# Patient Record
Sex: Female | Born: 1950
Health system: Southern US, Community
[De-identification: ages and names within clinical notes are randomized; demographics above are authoritative.]

## PROBLEM LIST (undated history)

## (undated) DIAGNOSIS — F32A Depression, unspecified: Secondary | ICD-10-CM

## (undated) DIAGNOSIS — F329 Major depressive disorder, single episode, unspecified: Secondary | ICD-10-CM

## (undated) DIAGNOSIS — I10 Essential (primary) hypertension: Secondary | ICD-10-CM

## (undated) DIAGNOSIS — E538 Deficiency of other specified B group vitamins: Secondary | ICD-10-CM

## (undated) HISTORY — DX: Major depressive disorder, single episode, unspecified: F32.9

## (undated) HISTORY — DX: Depression, unspecified: F32.A

## (undated) HISTORY — DX: Essential (primary) hypertension: I10

## (undated) HISTORY — DX: Deficiency of other specified B group vitamins: E53.8

## (undated) HISTORY — PX: TUBAL LIGATION: SHX77

---

## 1999-10-15 ENCOUNTER — Other Ambulatory Visit: Admission: RE | Admit: 1999-10-15 | Discharge: 1999-10-15 | Payer: Self-pay | Admitting: Family Medicine

## 2010-06-27 ENCOUNTER — Encounter: Payer: Self-pay | Admitting: Internal Medicine

## 2010-07-02 NOTE — Letter (Signed)
Summary: Pre Visit Letter Revised  [facility]  [address] [place], [place] [address]   Phone: [phone]  Fax: [phone]        06/27/2010 MRN: [id number]  [facility] [address] [place], [place]  [address]             Procedure Date: 08-15-10 10:30am           Dr Leone Payor Direct Colon   Welcome to the Gastroenterology Division at [facility].    You are scheduled to see a nurse for your pre-procedure visit on 08-01-10 at 2:30pm on the 3rd floor at [facility], [address]  We ask that you try to arrive at our office 15 minutes prior to your appointment time to allow for check-in.  Please take a minute to review the attached form.  If you answer "Yes" to one or more of the questions on the first page, we ask that you call the person listed at your earliest opportunity.  If you answer "No" to all of the questions, please complete the rest of the form and bring it to your appointment.    Your nurse visit will consist of discussing your medical and surgical history, your immediate family medical history, and your medications.   If you are unable to list all of your medications on the form, please bring the medication bottles to your appointment and we will list them.  We will need to be aware of both prescribed and over the counter drugs.  We will need to know exact dosage information as well.    Please be prepared to read and sign documents such as consent forms, a financial agreement, and acknowledgement forms.  If necessary, and with your consent, a friend or relative is welcome to sit-in on the nurse visit with you.  Please bring your insurance card so that we may make a copy of it.  If your insurance requires a referral to see a specialist, please bring your referral form from your primary care physician.  No co-pay is required for this nurse visit.     If you cannot keep your appointment, please call [phone] to cancel or reschedule  prior to your appointment date.  This allows [place] the opportunity to schedule an appointment for another patient in need of care.    Thank you for choosing [facility] for your medical needs.  We appreciate the opportunity to care for you.  Please visit [place] at our website  to learn more about our practice.  Sincerely, The Gastroenterology Division

## 2010-08-01 ENCOUNTER — Ambulatory Visit (AMBULATORY_SURGERY_CENTER): Payer: Managed Care, Other (non HMO) | Admitting: *Deleted

## 2010-08-01 VITALS — Ht 62.0 in | Wt 232.0 lb

## 2010-08-01 DIAGNOSIS — Z1211 Encounter for screening for malignant neoplasm of colon: Secondary | ICD-10-CM

## 2010-08-01 MED ORDER — PEG-KCL-NACL-NASULF-NA ASC-C 100 G PO SOLR: ORAL | Status: DC

## 2010-08-14 ENCOUNTER — Encounter: Payer: Self-pay | Admitting: Internal Medicine

## 2010-08-15 ENCOUNTER — Encounter: Payer: Self-pay | Admitting: Internal Medicine

## 2010-08-15 ENCOUNTER — Ambulatory Visit: Payer: Managed Care, Other (non HMO) | Admitting: Internal Medicine

## 2010-08-15 VITALS — BP 116/53 | HR 81 | Temp 98.1°F | Resp 18 | Ht 62.0 in | Wt 225.0 lb

## 2010-08-15 DIAGNOSIS — D126 Benign neoplasm of colon, unspecified: Secondary | ICD-10-CM

## 2010-08-15 DIAGNOSIS — K5730 Diverticulosis of large intestine without perforation or abscess without bleeding: Secondary | ICD-10-CM

## 2010-08-15 DIAGNOSIS — Z1211 Encounter for screening for malignant neoplasm of colon: Secondary | ICD-10-CM

## 2010-08-15 DIAGNOSIS — K621 Rectal polyp: Secondary | ICD-10-CM

## 2010-08-15 DIAGNOSIS — D1330 Benign neoplasm of unspecified part of small intestine: Secondary | ICD-10-CM

## 2010-08-15 DIAGNOSIS — D128 Benign neoplasm of rectum: Secondary | ICD-10-CM

## 2010-08-15 DIAGNOSIS — D129 Benign neoplasm of anus and anal canal: Secondary | ICD-10-CM

## 2010-08-15 DIAGNOSIS — K620 Anal polyp: Secondary | ICD-10-CM

## 2010-08-15 LAB — GLUCOSE, CAPILLARY: Glucose-Capillary: 125 mg/dL — ABNORMAL HIGH (ref 70–99)

## 2010-08-15 MED ORDER — SODIUM CHLORIDE 0.9 % IV SOLN: 500.0000 mL | INTRAVENOUS | Status: DC

## 2010-08-15 NOTE — Patient Instructions (Signed)
HANDOUTS GIVEN ON POLYPS, DIVERTICULOSIS, HIGH FIBER DIET, HEMORRHOIDS YOU WILL RECEIVE A LETTER FROM DR Leone Payor IN 1-2 WEEKS WITH PATHOLOGY REPORT RESULTS AND HIS RECOMMENDATIONS FOR YOUR NEXT PROCEDURE

## 2010-08-16 ENCOUNTER — Telehealth: Payer: Self-pay

## 2010-08-16 NOTE — Telephone Encounter (Signed)
I called home # and there was no answer.  MAW

## 2010-08-21 ENCOUNTER — Encounter: Payer: Self-pay | Admitting: Internal Medicine

## 2010-08-21 NOTE — Progress Notes (Signed)
Quick Note:  No adenomas 92 hyperplastic polyps) Repeat colonoscopy 10 years 2022 ______

## 2010-10-01 ENCOUNTER — Telehealth: Payer: Self-pay | Admitting: Internal Medicine

## 2010-10-01 NOTE — Telephone Encounter (Signed)
Have her see PCP and please ask them to send [place] their eval Make sure they have the records from [place] as well - including nursing notes from procedure Not normal but sometimes there is post IV pain - different causes and will await PCP eval

## 2010-10-01 NOTE — Telephone Encounter (Signed)
I spoke with the patient and Dr Tomasa Blase.  They will send a copy of the note.  I have faxed the documentation that pertain to the IV from the procedure from 08/15/10

## 2010-10-01 NOTE — Telephone Encounter (Signed)
Patient had a colon on 08/15/10.  Since then she  Has pain in the hand her IV was placed in.  The pain is not daily.  It is a throbbing pain.  There is no redness, swelling, or signs of infection.  She states the vein is not hard, but when she touches the site it is painful.  She is wondering if this was normal.  She states she will have a day of pain then may go three or four days with no pain at all.  She dose have an appt with her primary MD on Thursday and she is going to have him look at her hand.  Dr Leone Payor do you have any suggestion?

## 2010-10-17 ENCOUNTER — Telehealth: Payer: Self-pay

## 2010-10-17 NOTE — Telephone Encounter (Signed)
CQI F/U of pt c/o hand pain at IV site.  Spoke w/pt today, she says she still has some tingling at times and on occasion has sharp pain in her hand.  She saw her PCP, Dr. Veatrice Kells from [facility], for R/T Phys. on 10/03/2010 and mentioned the hand to him - she reports that he told her that he could see where the IV had been and that it would just take time for the pain to get better.  She stated that she will just give it time to get better.  I told the patient that if over time she did not see improvement she should call the office to report the continued symptoms.

## 2010-10-22 ENCOUNTER — Encounter: Payer: Self-pay | Admitting: Internal Medicine

## 2010-12-05 NOTE — Progress Notes (Signed)
Addended by: Virgel Paling on: 12/05/2010 12:05 PM   Modules accepted: Orders

## 2010-12-05 NOTE — Progress Notes (Signed)
Addended by: Virgel Paling on: 12/05/2010 12:04 PM   Modules accepted: Level of Service

## 2011-01-07 ENCOUNTER — Telehealth: Payer: Self-pay

## 2011-01-07 NOTE — Telephone Encounter (Signed)
Shelby Mendoza called last week and inadvertently got Shelby Dane, LPN from [employer] instead of me.  Shelby Mendoza left me a note with Shelby Mendoza contact information and her comments.  The pt told Shelby Mendoza that her IV site in her hand was not better and actually seems worse. She said she had problems opening her medication bottles.  I phoned Shelby Mendoza this morning to ask how she was doing. She told me that her hand was "still sore" and that she has" had some pain and tingling up her arm, especially at night."  She also states, "it comes and goes." She related that she has some problems opening her medication bottles at times due to the soreness in her hand.  She reminded me that she had seen her medical doctor shortly after the procedure and he told her that he could see the place on her hand where the IV site was and that it would just take time to heal.  She said she was satisfied to be patient and see what happens.

## 2011-09-17 ENCOUNTER — Ambulatory Visit (HOSPITAL_COMMUNITY)
Admission: RE | Admit: 2011-09-17 | Discharge: 2011-09-17 | Disposition: A | Discharge status: Home / Self Care | Payer: Managed Care, Other (non HMO) | Attending: Psychiatry | Admitting: Psychiatry

## 2011-09-17 DIAGNOSIS — E538 Deficiency of other specified B group vitamins: Secondary | ICD-10-CM | POA: Insufficient documentation

## 2011-09-17 DIAGNOSIS — Z800 Family history of malignant neoplasm of digestive organs: Secondary | ICD-10-CM | POA: Insufficient documentation

## 2011-09-17 DIAGNOSIS — F29 Unspecified psychosis not due to a substance or known physiological condition: Secondary | ICD-10-CM | POA: Insufficient documentation

## 2011-09-17 DIAGNOSIS — E119 Type 2 diabetes mellitus without complications: Secondary | ICD-10-CM | POA: Insufficient documentation

## 2011-09-17 DIAGNOSIS — F329 Major depressive disorder, single episode, unspecified: Secondary | ICD-10-CM | POA: Insufficient documentation

## 2011-09-17 DIAGNOSIS — F3289 Other specified depressive episodes: Secondary | ICD-10-CM | POA: Insufficient documentation

## 2011-09-17 NOTE — BH Assessment (Signed)
Assessment Note   Shelby Mendoza is an 61 y.o. female. PT PRESENTED TO [facility] WITH HER HUSBAND AND DAUGHTER DUE TO CHANGE IN MENTAL STATUS AFTER TAKING ANTIBIOTICS FOR 20 DAYS AND GETTING KENALOG INJECTION. Sunday PT BEGAN SCREAMING AND KNOCKED OBJECTS OFF THE TABLE. 2 DAYS AGO SHE HAD A EPISODE OF CURSING AND FAMILY MEMBERS REPORTED SHE NEVER CURSES.  YESTERDAY SHE CONTINUED TO TAKE HER CLOTHES OFF AT HOME AFTER HER HUSBAND REDRESSED HER SEVERAL TIMES. FAMILY REPORTED PT HAS HAD CONFLICT WITH HER BROTHER-IN-LAW OVER THE CARE OF HER ILL SISTER. SHE ALSO RECENTLY RETIRED AND TRIES TO DO EVERYTHING. SHE HAS BEEN VERY STRESSED LATELY. PT'S MOTHER DIED OVER 30 YEARS AGO AND PT NEVER DELT WITH HER DEATH AND NOW IT'S AN ONGOING TOPIC WITH PT TRYING TO DEAL WITH IT.  PT WAS  TAKEN TO [facility], GIVEN A CAT SCAN AND BLOOD WORK WAS COMPLETED. PT WAS INFORMED EVERYTHING WAS NORMAL AND WAS DISCHARGED.  ACCORDING TO FAMILY PT GOT ABOUT ELEVEN HOURS SLEEP LAST NIGHT AND HAS NOT HAD AN EPISODE TODAY. PT WAS VERY TALKATIVE AT TIMES BUT STILL NOT IN A MANIC STATE. PT DENIES S/I, H/I AND IS NOT CURRENTLY PSYCHOTIC.  FAMILY WANTS TO GET PT INVOLVED WITH A PSYCHIATRIST. DISCUSSED CASE WITH DR READLING WHO REPORTS PT MAY HAVE SON DELIRIUM GOING ON AND WOULD NEED TO GO THE THE ER FOR MEDICAL CLEARANCE. PT AND SPOUSE PREFERS OUT PATIENT REFERRAL. PT REFERRED TO DR PLOVSKY.        Axis I: Psychotic Disorder NOS Axis II: Deferred Axis III:  Past Medical History  Diagnosis Date  . Hypertension   . Depression   . Vitamin B12 deficiency   . Diabetes mellitus    Axis IV: problems related to social environment Axis V: 61-70 mild symptoms        Past Medical History:  Past Medical History  Diagnosis Date  . Hypertension   . Depression   . Vitamin B12 deficiency   . Diabetes mellitus     Past Surgical History  Procedure Date  . Tubal ligation     Family History:  Family History  Problem  Relation Age of Onset  . Colon cancer Maternal Aunt     Social History:  reports that she has never smoked. She has never used smokeless tobacco. She reports that she does not drink alcohol. Her drug history not on file.  Additional Social History:     CIWA:   COWS:    Allergies: No Known Allergies  Home Medications:  (Not in a hospital admission)  OB/GYN Status:  No LMP recorded. Patient is postmenopausal.  General Assessment Data Location of Assessment: [facility] Assessment Services Living Arrangements: Spouse/significant other Can pt return to current living arrangement?: Yes Admission Status: Voluntary Is patient capable of signing voluntary admission?: Yes Transfer from: Home Referral Source: Self/Family/Friend  Education Status Is patient currently in school?: No Contact person: Viviann Spare [phone] (spouse)  Risk to self Suicidal Ideation: No Suicidal Intent: No Is patient at risk for suicide?: No Suicidal Plan?: No Access to Means: No What has been your use of drugs/alcohol within the last 12 months?: none Previous Attempts/Gestures: No How many times?: 0  Other Self Harm Risks: na Triggers for Past Attempts: None known Intentional Self Injurious Behavior: None Family Suicide History: No Recent stressful life event(s): Turmoil (Comment) (with brother-in law) Persecutory voices/beliefs?: No Depression: No Substance abuse history and/or treatment for substance abuse?: No Suicide prevention information given to non-admitted patients:  Not applicable  Risk to Others Homicidal Ideation: No Thoughts of Harm to Others: No Current Homicidal Intent: No Current Homicidal Plan: No Access to Homicidal Means: No Identified Victim: na History of harm to others?: No Assessment of Violence: None Noted Violent Behavior Description: na Does patient have access to weapons?: No Criminal Charges Pending?: No Does patient have a court date:  No  Psychosis Hallucinations: None noted Delusions: Unspecified (changes in behavior)  Mental Status Report Appear/Hygiene: Improved Eye Contact: Good Motor Activity: Freedom of movement Speech: Logical/coherent Level of Consciousness: Alert Mood: Ashamed/humiliated Affect: Appropriate to circumstance Anxiety Level: Minimal Judgement: Unimpaired Orientation: Person;Place;Time;Situation Obsessive Compulsive Thoughts/Behaviors: None  Cognitive Functioning Concentration: Decreased Memory: Recent Intact;Remote Intact IQ: Average Insight: Good Impulse Control: Good Appetite: Good Sleep: No Change Total Hours of Sleep: 8  Vegetative Symptoms: None  ADLScreening [facility] Assessment Services) Patient's cognitive ability adequate to safely complete daily activities?: Yes Patient able to express need for assistance with ADLs?: Yes Independently performs ADLs?: Yes  Abuse/Neglect [facility]) Physical Abuse: Denies Verbal Abuse: Denies Sexual Abuse: Denies  Prior Inpatient Therapy Prior Inpatient Therapy: No  Prior Outpatient Therapy Prior Outpatient Therapy: No  ADL Screening (condition at time of admission) Patient's cognitive ability adequate to safely complete daily activities?: Yes Patient able to express need for assistance with ADLs?: Yes Independently performs ADLs?: Yes       Abuse/Neglect Assessment (Assessment to be complete while patient is alone) Physical Abuse: Denies Verbal Abuse: Denies Sexual Abuse: Denies          Additional Information 1:1 In Past 12 Months?: No CIRT Risk: No Elopement Risk: No Does patient have medical clearance?: No     Disposition:  REFERRED TO DR PLOVSKY   Disposition Disposition of Patient: Referred to (DR PLOVSKY) Patient referred to: Other (Comment) (DR PLOVSKY)  On Site Evaluation by:  DR Allena Katz Reviewed with Physician:     Hattie Perch Winford 09/17/2011 11:47 AM

## 2015-04-04 DIAGNOSIS — H6123 Impacted cerumen, bilateral: Secondary | ICD-10-CM | POA: Diagnosis not present

## 2015-04-04 DIAGNOSIS — R7301 Impaired fasting glucose: Secondary | ICD-10-CM | POA: Diagnosis not present

## 2015-04-04 DIAGNOSIS — D510 Vitamin B12 deficiency anemia due to intrinsic factor deficiency: Secondary | ICD-10-CM | POA: Diagnosis not present

## 2015-04-04 DIAGNOSIS — I10 Essential (primary) hypertension: Secondary | ICD-10-CM | POA: Diagnosis not present

## 2015-04-04 DIAGNOSIS — E785 Hyperlipidemia, unspecified: Secondary | ICD-10-CM | POA: Diagnosis not present

## 2015-04-04 DIAGNOSIS — Z6836 Body mass index (BMI) 36.0-36.9, adult: Secondary | ICD-10-CM | POA: Diagnosis not present

## 2015-04-04 DIAGNOSIS — M545 Low back pain: Secondary | ICD-10-CM | POA: Diagnosis not present

## 2015-06-26 DIAGNOSIS — M5137 Other intervertebral disc degeneration, lumbosacral region: Secondary | ICD-10-CM | POA: Diagnosis not present

## 2015-06-26 DIAGNOSIS — M62830 Muscle spasm of back: Secondary | ICD-10-CM | POA: Diagnosis not present

## 2015-06-26 DIAGNOSIS — M5136 Other intervertebral disc degeneration, lumbar region: Secondary | ICD-10-CM | POA: Diagnosis not present

## 2015-06-26 DIAGNOSIS — D259 Leiomyoma of uterus, unspecified: Secondary | ICD-10-CM | POA: Diagnosis not present

## 2015-06-26 DIAGNOSIS — M4696 Unspecified inflammatory spondylopathy, lumbar region: Secondary | ICD-10-CM | POA: Diagnosis not present

## 2015-06-26 DIAGNOSIS — Z6837 Body mass index (BMI) 37.0-37.9, adult: Secondary | ICD-10-CM | POA: Diagnosis not present

## 2015-07-19 DIAGNOSIS — M62830 Muscle spasm of back: Secondary | ICD-10-CM | POA: Diagnosis not present

## 2015-07-19 DIAGNOSIS — Z6837 Body mass index (BMI) 37.0-37.9, adult: Secondary | ICD-10-CM | POA: Diagnosis not present

## 2015-07-19 DIAGNOSIS — M5137 Other intervertebral disc degeneration, lumbosacral region: Secondary | ICD-10-CM | POA: Diagnosis not present

## 2015-07-20 ENCOUNTER — Other Ambulatory Visit: Payer: Self-pay | Admitting: Internal Medicine

## 2015-07-20 DIAGNOSIS — M5136 Other intervertebral disc degeneration, lumbar region: Secondary | ICD-10-CM

## 2015-07-24 DIAGNOSIS — Z6837 Body mass index (BMI) 37.0-37.9, adult: Secondary | ICD-10-CM | POA: Diagnosis not present

## 2015-07-24 DIAGNOSIS — E669 Obesity, unspecified: Secondary | ICD-10-CM | POA: Diagnosis not present

## 2015-07-24 DIAGNOSIS — M5137 Other intervertebral disc degeneration, lumbosacral region: Secondary | ICD-10-CM | POA: Diagnosis not present

## 2015-08-07 ENCOUNTER — Ambulatory Visit
Admission: RE | Admit: 2015-08-07 | Discharge: 2015-08-07 | Disposition: A | Discharge status: Home / Self Care | Payer: Medicare HMO | Source: Ambulatory Visit | Attending: Internal Medicine | Admitting: Internal Medicine

## 2015-08-07 DIAGNOSIS — M5136 Other intervertebral disc degeneration, lumbar region: Secondary | ICD-10-CM

## 2015-08-07 DIAGNOSIS — M4806 Spinal stenosis, lumbar region: Secondary | ICD-10-CM | POA: Diagnosis not present

## 2015-08-08 DIAGNOSIS — Z6837 Body mass index (BMI) 37.0-37.9, adult: Secondary | ICD-10-CM | POA: Diagnosis not present

## 2015-08-08 DIAGNOSIS — M5137 Other intervertebral disc degeneration, lumbosacral region: Secondary | ICD-10-CM | POA: Diagnosis not present

## 2015-08-08 DIAGNOSIS — M62830 Muscle spasm of back: Secondary | ICD-10-CM | POA: Diagnosis not present

## 2015-08-08 DIAGNOSIS — M5416 Radiculopathy, lumbar region: Secondary | ICD-10-CM | POA: Diagnosis not present

## 2015-08-08 DIAGNOSIS — M9983 Other biomechanical lesions of lumbar region: Secondary | ICD-10-CM | POA: Diagnosis not present

## 2015-08-27 DIAGNOSIS — M4315 Spondylolisthesis, thoracolumbar region: Secondary | ICD-10-CM | POA: Diagnosis not present

## 2015-08-27 DIAGNOSIS — M549 Dorsalgia, unspecified: Secondary | ICD-10-CM | POA: Diagnosis not present

## 2015-08-27 DIAGNOSIS — I10 Essential (primary) hypertension: Secondary | ICD-10-CM | POA: Diagnosis not present

## 2015-08-27 DIAGNOSIS — Z6837 Body mass index (BMI) 37.0-37.9, adult: Secondary | ICD-10-CM | POA: Diagnosis not present

## 2015-08-29 ENCOUNTER — Other Ambulatory Visit: Payer: Self-pay | Admitting: Neurosurgery

## 2015-08-30 ENCOUNTER — Other Ambulatory Visit: Payer: Self-pay | Admitting: Neurosurgery

## 2015-08-30 DIAGNOSIS — M4315 Spondylolisthesis, thoracolumbar region: Secondary | ICD-10-CM

## 2015-09-11 ENCOUNTER — Ambulatory Visit
Admission: RE | Admit: 2015-09-11 | Discharge: 2015-09-11 | Disposition: A | Discharge status: Home / Self Care | Payer: Medicare HMO | Source: Ambulatory Visit | Attending: Neurosurgery | Admitting: Neurosurgery

## 2015-09-11 DIAGNOSIS — M5416 Radiculopathy, lumbar region: Secondary | ICD-10-CM | POA: Diagnosis not present

## 2015-09-11 DIAGNOSIS — M4315 Spondylolisthesis, thoracolumbar region: Secondary | ICD-10-CM

## 2015-09-11 MED ORDER — IOPAMIDOL (ISOVUE-M 200) INJECTION 41%: 1.0000 mL | Freq: Once | INTRAMUSCULAR | Status: DC

## 2015-09-11 NOTE — Discharge Instructions (Signed)
Cyst Aspiration Post Procedure Discharge Instructions  1. May resume a regular diet and any medications that you routinely take (including pain medications). 2. No driving day of procedure. 3. Upon discharge go home and rest for at least 4 hours.  May use an ice pack as needed to injection sites on back. 4. Remove bandades later, today.    Please contact our office at [phone] for the following symptoms:   Fever greater than 100 degrees  Increased swelling, pain, or redness at injection site.   Thank you for visiting [facility].

## 2015-09-24 DIAGNOSIS — M4315 Spondylolisthesis, thoracolumbar region: Secondary | ICD-10-CM | POA: Diagnosis not present

## 2015-09-24 DIAGNOSIS — Z6839 Body mass index (BMI) 39.0-39.9, adult: Secondary | ICD-10-CM | POA: Diagnosis not present

## 2015-10-11 DIAGNOSIS — E785 Hyperlipidemia, unspecified: Secondary | ICD-10-CM | POA: Diagnosis not present

## 2015-10-11 DIAGNOSIS — Z1389 Encounter for screening for other disorder: Secondary | ICD-10-CM | POA: Diagnosis not present

## 2015-10-11 DIAGNOSIS — I10 Essential (primary) hypertension: Secondary | ICD-10-CM | POA: Diagnosis not present

## 2015-10-11 DIAGNOSIS — D510 Vitamin B12 deficiency anemia due to intrinsic factor deficiency: Secondary | ICD-10-CM | POA: Diagnosis not present

## 2015-10-11 DIAGNOSIS — R7301 Impaired fasting glucose: Secondary | ICD-10-CM | POA: Diagnosis not present

## 2015-10-11 DIAGNOSIS — Z0000 Encounter for general adult medical examination without abnormal findings: Secondary | ICD-10-CM | POA: Diagnosis not present

## 2015-10-11 DIAGNOSIS — Z6836 Body mass index (BMI) 36.0-36.9, adult: Secondary | ICD-10-CM | POA: Diagnosis not present

## 2015-10-11 DIAGNOSIS — Z9181 History of falling: Secondary | ICD-10-CM | POA: Diagnosis not present

## 2015-10-18 DIAGNOSIS — M25551 Pain in right hip: Secondary | ICD-10-CM | POA: Diagnosis not present

## 2015-10-18 DIAGNOSIS — Z6837 Body mass index (BMI) 37.0-37.9, adult: Secondary | ICD-10-CM | POA: Diagnosis not present

## 2015-10-18 DIAGNOSIS — M160 Bilateral primary osteoarthritis of hip: Secondary | ICD-10-CM | POA: Diagnosis not present

## 2015-11-19 DIAGNOSIS — M4806 Spinal stenosis, lumbar region: Secondary | ICD-10-CM | POA: Diagnosis not present

## 2015-11-19 DIAGNOSIS — M545 Low back pain: Secondary | ICD-10-CM | POA: Diagnosis not present

## 2015-11-19 DIAGNOSIS — M5137 Other intervertebral disc degeneration, lumbosacral region: Secondary | ICD-10-CM | POA: Diagnosis not present

## 2015-11-19 DIAGNOSIS — M47817 Spondylosis without myelopathy or radiculopathy, lumbosacral region: Secondary | ICD-10-CM | POA: Diagnosis not present

## 2016-04-28 DIAGNOSIS — Z6838 Body mass index (BMI) 38.0-38.9, adult: Secondary | ICD-10-CM | POA: Diagnosis not present

## 2016-04-28 DIAGNOSIS — H6693 Otitis media, unspecified, bilateral: Secondary | ICD-10-CM | POA: Diagnosis not present

## 2016-07-22 DIAGNOSIS — R69 Illness, unspecified: Secondary | ICD-10-CM | POA: Diagnosis not present

## 2016-09-24 DIAGNOSIS — Z0101 Encounter for examination of eyes and vision with abnormal findings: Secondary | ICD-10-CM | POA: Diagnosis not present

## 2016-10-14 DIAGNOSIS — Z1389 Encounter for screening for other disorder: Secondary | ICD-10-CM | POA: Diagnosis not present

## 2016-10-14 DIAGNOSIS — E119 Type 2 diabetes mellitus without complications: Secondary | ICD-10-CM | POA: Diagnosis not present

## 2016-10-14 DIAGNOSIS — Z9181 History of falling: Secondary | ICD-10-CM | POA: Diagnosis not present

## 2016-10-14 DIAGNOSIS — Z6836 Body mass index (BMI) 36.0-36.9, adult: Secondary | ICD-10-CM | POA: Diagnosis not present

## 2016-10-14 DIAGNOSIS — I10 Essential (primary) hypertension: Secondary | ICD-10-CM | POA: Diagnosis not present

## 2016-10-14 DIAGNOSIS — M5137 Other intervertebral disc degeneration, lumbosacral region: Secondary | ICD-10-CM | POA: Diagnosis not present

## 2016-10-14 DIAGNOSIS — M4316 Spondylolisthesis, lumbar region: Secondary | ICD-10-CM | POA: Diagnosis not present

## 2016-10-14 DIAGNOSIS — R7301 Impaired fasting glucose: Secondary | ICD-10-CM | POA: Diagnosis not present

## 2016-10-14 DIAGNOSIS — M48061 Spinal stenosis, lumbar region without neurogenic claudication: Secondary | ICD-10-CM | POA: Diagnosis not present

## 2016-10-14 DIAGNOSIS — E785 Hyperlipidemia, unspecified: Secondary | ICD-10-CM | POA: Diagnosis not present

## 2016-10-14 DIAGNOSIS — D510 Vitamin B12 deficiency anemia due to intrinsic factor deficiency: Secondary | ICD-10-CM | POA: Diagnosis not present

## 2016-10-15 DIAGNOSIS — E119 Type 2 diabetes mellitus without complications: Secondary | ICD-10-CM | POA: Diagnosis not present

## 2016-10-18 DIAGNOSIS — R69 Illness, unspecified: Secondary | ICD-10-CM | POA: Diagnosis not present

## 2017-01-08 DIAGNOSIS — E119 Type 2 diabetes mellitus without complications: Secondary | ICD-10-CM | POA: Diagnosis not present

## 2017-01-20 DIAGNOSIS — R69 Illness, unspecified: Secondary | ICD-10-CM | POA: Diagnosis not present

## 2017-04-09 DIAGNOSIS — E119 Type 2 diabetes mellitus without complications: Secondary | ICD-10-CM | POA: Diagnosis not present

## 2017-04-27 DIAGNOSIS — R69 Illness, unspecified: Secondary | ICD-10-CM | POA: Diagnosis not present

## 2017-06-08 DIAGNOSIS — M26622 Arthralgia of left temporomandibular joint: Secondary | ICD-10-CM | POA: Diagnosis not present

## 2017-06-08 DIAGNOSIS — Z6836 Body mass index (BMI) 36.0-36.9, adult: Secondary | ICD-10-CM | POA: Diagnosis not present

## 2017-06-08 DIAGNOSIS — R350 Frequency of micturition: Secondary | ICD-10-CM | POA: Diagnosis not present

## 2017-07-07 DIAGNOSIS — E119 Type 2 diabetes mellitus without complications: Secondary | ICD-10-CM | POA: Diagnosis not present

## 2017-07-21 DIAGNOSIS — R69 Illness, unspecified: Secondary | ICD-10-CM | POA: Diagnosis not present

## 2017-08-25 DIAGNOSIS — I10 Essential (primary) hypertension: Secondary | ICD-10-CM | POA: Diagnosis not present

## 2017-08-25 DIAGNOSIS — Z6836 Body mass index (BMI) 36.0-36.9, adult: Secondary | ICD-10-CM | POA: Diagnosis not present

## 2017-08-25 DIAGNOSIS — M545 Low back pain: Secondary | ICD-10-CM | POA: Diagnosis not present

## 2017-09-08 DIAGNOSIS — L821 Other seborrheic keratosis: Secondary | ICD-10-CM | POA: Diagnosis not present

## 2017-09-08 DIAGNOSIS — L814 Other melanin hyperpigmentation: Secondary | ICD-10-CM | POA: Diagnosis not present

## 2017-09-08 DIAGNOSIS — D224 Melanocytic nevi of scalp and neck: Secondary | ICD-10-CM | POA: Diagnosis not present

## 2017-09-08 DIAGNOSIS — L578 Other skin changes due to chronic exposure to nonionizing radiation: Secondary | ICD-10-CM | POA: Diagnosis not present

## 2017-09-08 DIAGNOSIS — C44329 Squamous cell carcinoma of skin of other parts of face: Secondary | ICD-10-CM | POA: Diagnosis not present

## 2017-10-05 DIAGNOSIS — E119 Type 2 diabetes mellitus without complications: Secondary | ICD-10-CM | POA: Diagnosis not present

## 2017-11-17 ENCOUNTER — Other Ambulatory Visit: Payer: Self-pay | Admitting: Neurosurgery

## 2017-11-17 DIAGNOSIS — M4316 Spondylolisthesis, lumbar region: Secondary | ICD-10-CM

## 2017-11-25 ENCOUNTER — Ambulatory Visit
Admission: RE | Admit: 2017-11-25 | Discharge: 2017-11-25 | Disposition: A | Discharge status: Home / Self Care | Payer: Medicare HMO | Source: Ambulatory Visit | Attending: Neurosurgery | Admitting: Neurosurgery

## 2017-11-25 DIAGNOSIS — M48061 Spinal stenosis, lumbar region without neurogenic claudication: Secondary | ICD-10-CM | POA: Diagnosis not present

## 2017-11-25 DIAGNOSIS — M4316 Spondylolisthesis, lumbar region: Secondary | ICD-10-CM

## 2017-12-02 DIAGNOSIS — R7301 Impaired fasting glucose: Secondary | ICD-10-CM | POA: Diagnosis not present

## 2017-12-02 DIAGNOSIS — M4316 Spondylolisthesis, lumbar region: Secondary | ICD-10-CM | POA: Diagnosis not present

## 2017-12-02 DIAGNOSIS — M5137 Other intervertebral disc degeneration, lumbosacral region: Secondary | ICD-10-CM | POA: Diagnosis not present

## 2017-12-02 DIAGNOSIS — D510 Vitamin B12 deficiency anemia due to intrinsic factor deficiency: Secondary | ICD-10-CM | POA: Diagnosis not present

## 2017-12-02 DIAGNOSIS — Z6836 Body mass index (BMI) 36.0-36.9, adult: Secondary | ICD-10-CM | POA: Diagnosis not present

## 2017-12-02 DIAGNOSIS — E785 Hyperlipidemia, unspecified: Secondary | ICD-10-CM | POA: Diagnosis not present

## 2017-12-02 DIAGNOSIS — M48061 Spinal stenosis, lumbar region without neurogenic claudication: Secondary | ICD-10-CM | POA: Diagnosis not present

## 2017-12-02 DIAGNOSIS — I10 Essential (primary) hypertension: Secondary | ICD-10-CM | POA: Diagnosis not present

## 2017-12-09 DIAGNOSIS — M4316 Spondylolisthesis, lumbar region: Secondary | ICD-10-CM | POA: Diagnosis not present

## 2017-12-09 DIAGNOSIS — M545 Low back pain: Secondary | ICD-10-CM | POA: Diagnosis not present

## 2017-12-09 DIAGNOSIS — M48062 Spinal stenosis, lumbar region with neurogenic claudication: Secondary | ICD-10-CM | POA: Diagnosis not present

## 2018-01-04 DIAGNOSIS — E119 Type 2 diabetes mellitus without complications: Secondary | ICD-10-CM | POA: Diagnosis not present

## 2018-01-07 DIAGNOSIS — I10 Essential (primary) hypertension: Secondary | ICD-10-CM | POA: Diagnosis not present

## 2018-01-07 DIAGNOSIS — Z1339 Encounter for screening examination for other mental health and behavioral disorders: Secondary | ICD-10-CM | POA: Diagnosis not present

## 2018-01-07 DIAGNOSIS — E785 Hyperlipidemia, unspecified: Secondary | ICD-10-CM | POA: Diagnosis not present

## 2018-01-07 DIAGNOSIS — Z1331 Encounter for screening for depression: Secondary | ICD-10-CM | POA: Diagnosis not present

## 2018-01-07 DIAGNOSIS — Z6835 Body mass index (BMI) 35.0-35.9, adult: Secondary | ICD-10-CM | POA: Diagnosis not present

## 2018-01-07 DIAGNOSIS — Z9181 History of falling: Secondary | ICD-10-CM | POA: Diagnosis not present

## 2018-01-07 DIAGNOSIS — J3489 Other specified disorders of nose and nasal sinuses: Secondary | ICD-10-CM | POA: Diagnosis not present

## 2018-03-04 DIAGNOSIS — R7301 Impaired fasting glucose: Secondary | ICD-10-CM | POA: Diagnosis not present

## 2018-03-04 DIAGNOSIS — D510 Vitamin B12 deficiency anemia due to intrinsic factor deficiency: Secondary | ICD-10-CM | POA: Diagnosis not present

## 2018-03-04 DIAGNOSIS — E785 Hyperlipidemia, unspecified: Secondary | ICD-10-CM | POA: Diagnosis not present

## 2018-04-04 DIAGNOSIS — E119 Type 2 diabetes mellitus without complications: Secondary | ICD-10-CM | POA: Diagnosis not present

## 2018-07-05 DIAGNOSIS — E119 Type 2 diabetes mellitus without complications: Secondary | ICD-10-CM | POA: Diagnosis not present

## 2018-07-27 DIAGNOSIS — R69 Illness, unspecified: Secondary | ICD-10-CM | POA: Diagnosis not present

## 2018-09-06 DIAGNOSIS — I10 Essential (primary) hypertension: Secondary | ICD-10-CM | POA: Diagnosis not present

## 2018-09-20 DIAGNOSIS — I10 Essential (primary) hypertension: Secondary | ICD-10-CM | POA: Diagnosis not present

## 2018-10-04 DIAGNOSIS — E119 Type 2 diabetes mellitus without complications: Secondary | ICD-10-CM | POA: Diagnosis not present

## 2018-10-18 DIAGNOSIS — D510 Vitamin B12 deficiency anemia due to intrinsic factor deficiency: Secondary | ICD-10-CM | POA: Diagnosis not present

## 2018-10-18 DIAGNOSIS — I10 Essential (primary) hypertension: Secondary | ICD-10-CM | POA: Diagnosis not present

## 2018-10-18 DIAGNOSIS — Z139 Encounter for screening, unspecified: Secondary | ICD-10-CM | POA: Diagnosis not present

## 2018-10-18 DIAGNOSIS — E785 Hyperlipidemia, unspecified: Secondary | ICD-10-CM | POA: Diagnosis not present

## 2018-10-18 DIAGNOSIS — R7301 Impaired fasting glucose: Secondary | ICD-10-CM | POA: Diagnosis not present

## 2018-10-20 DIAGNOSIS — R69 Illness, unspecified: Secondary | ICD-10-CM | POA: Diagnosis not present

## 2018-10-26 DIAGNOSIS — I10 Essential (primary) hypertension: Secondary | ICD-10-CM | POA: Diagnosis not present

## 2018-11-23 DIAGNOSIS — I10 Essential (primary) hypertension: Secondary | ICD-10-CM | POA: Diagnosis not present

## 2019-01-03 DIAGNOSIS — E119 Type 2 diabetes mellitus without complications: Secondary | ICD-10-CM | POA: Diagnosis not present

## 2019-01-10 DIAGNOSIS — I10 Essential (primary) hypertension: Secondary | ICD-10-CM | POA: Diagnosis not present

## 2019-01-17 DIAGNOSIS — R69 Illness, unspecified: Secondary | ICD-10-CM | POA: Diagnosis not present

## 2019-02-02 DIAGNOSIS — D510 Vitamin B12 deficiency anemia due to intrinsic factor deficiency: Secondary | ICD-10-CM | POA: Diagnosis not present

## 2019-02-02 DIAGNOSIS — Z9181 History of falling: Secondary | ICD-10-CM | POA: Diagnosis not present

## 2019-02-02 DIAGNOSIS — I10 Essential (primary) hypertension: Secondary | ICD-10-CM | POA: Diagnosis not present

## 2019-02-02 DIAGNOSIS — R5382 Chronic fatigue, unspecified: Secondary | ICD-10-CM | POA: Diagnosis not present

## 2019-02-02 DIAGNOSIS — H6121 Impacted cerumen, right ear: Secondary | ICD-10-CM | POA: Diagnosis not present

## 2019-02-02 DIAGNOSIS — D649 Anemia, unspecified: Secondary | ICD-10-CM | POA: Diagnosis not present

## 2019-03-08 DIAGNOSIS — I10 Essential (primary) hypertension: Secondary | ICD-10-CM | POA: Diagnosis not present

## 2019-03-21 ENCOUNTER — Other Ambulatory Visit: Payer: Self-pay | Admitting: Neurosurgery

## 2019-03-21 DIAGNOSIS — Z1231 Encounter for screening mammogram for malignant neoplasm of breast: Secondary | ICD-10-CM

## 2019-04-01 DIAGNOSIS — E119 Type 2 diabetes mellitus without complications: Secondary | ICD-10-CM | POA: Diagnosis not present

## 2019-04-27 DIAGNOSIS — I10 Essential (primary) hypertension: Secondary | ICD-10-CM | POA: Diagnosis not present

## 2019-06-01 IMAGING — MR MR LUMBAR SPINE W/O CM
4 of 5 series · 18 of 48 positions shown · non-contrast
Comparison: MRI lumbar spine 08/07/2015. Plain films 08/25/2017.
Percutaneous spinal aspiration procedure 09/11/2015.

CLINICAL DATA: Low back pain.  BILATERAL buttock and leg pain.

EXAM:
MRI LUMBAR SPINE WITHOUT CONTRAST
TECHNIQUE: Multiplanar, multisequence MR imaging of the lumbar spine was
performed. No intravenous contrast was administered.

[Series 6: T2 · sagittal · 4.0mm · 0.68mm/px · 6 of 15 slices shown (1 of 2)]
[im 1/15]
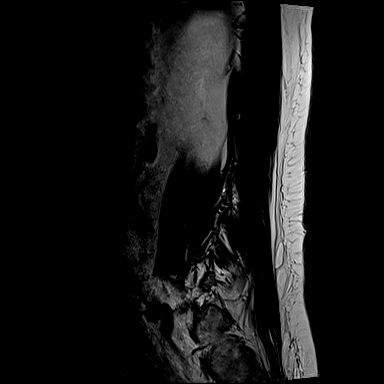
[im 3/15]
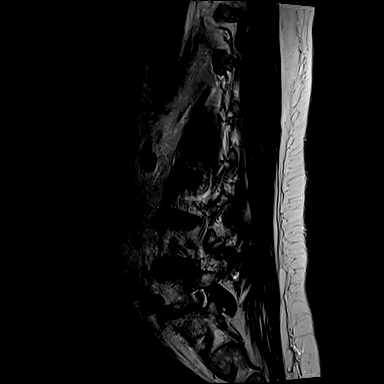
[im 6/15]
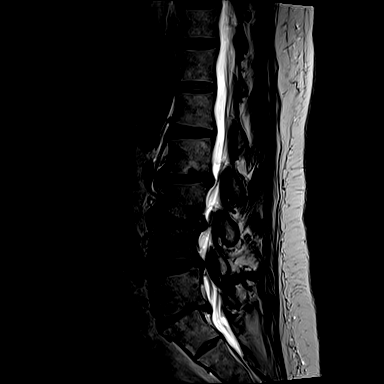
[im 9/15]
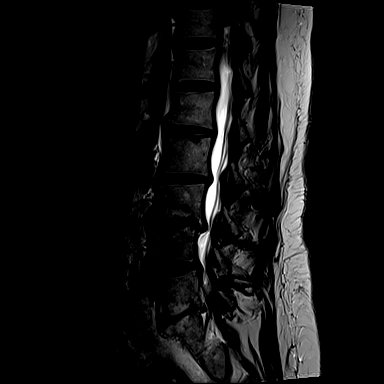
[im 12/15]
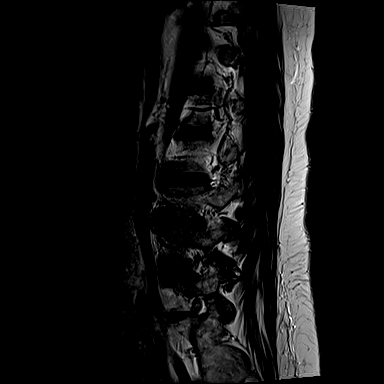
[im 15/15]
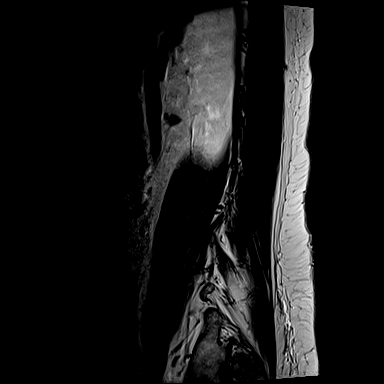

[Series 7: T1 · sagittal · 4.0mm · 0.68mm/px · 3 of 15 slices shown (1 of 2)]
[im 3/15]
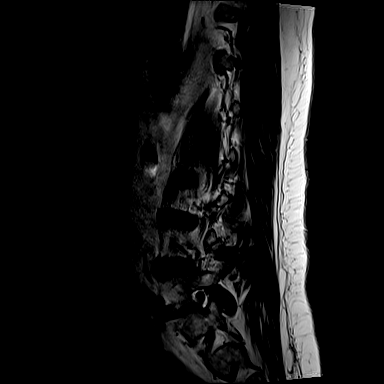
[im 9/15]
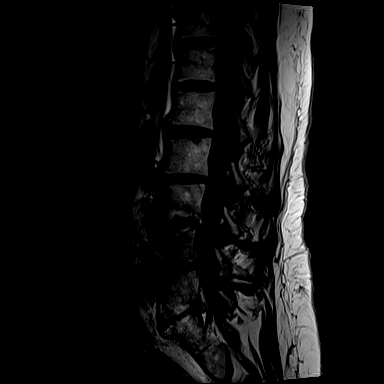
[im 15/15]
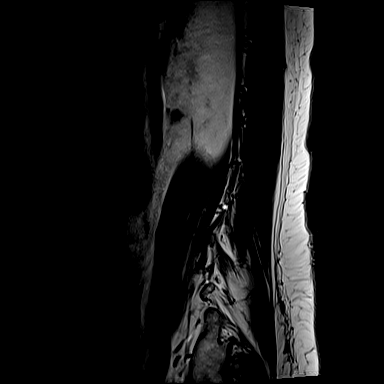

[Series 11: T1 · axial · 4.0mm · 0.28mm/px · z∈[-36,+117]mm · 3 of 39 slices shown (2 of 2)]
[im 6/39]
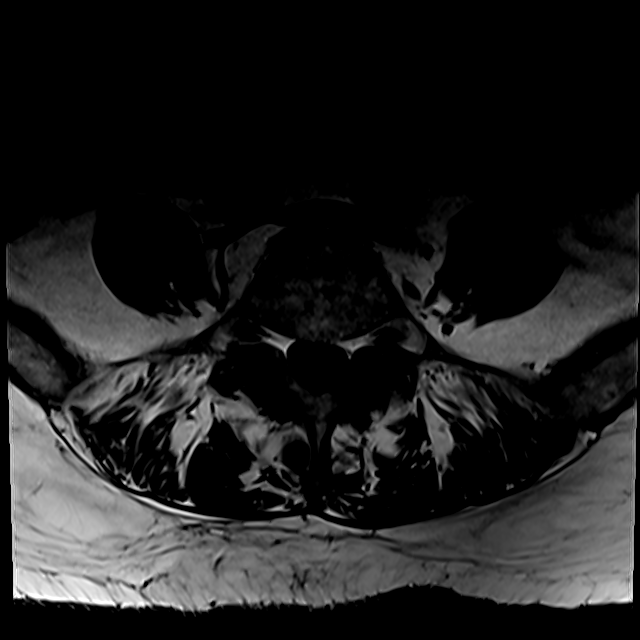
[im 20/39]
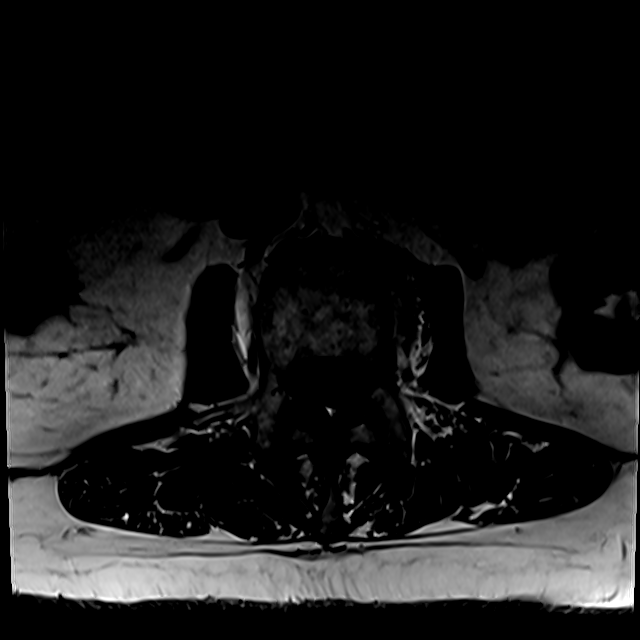
[im 33/39]
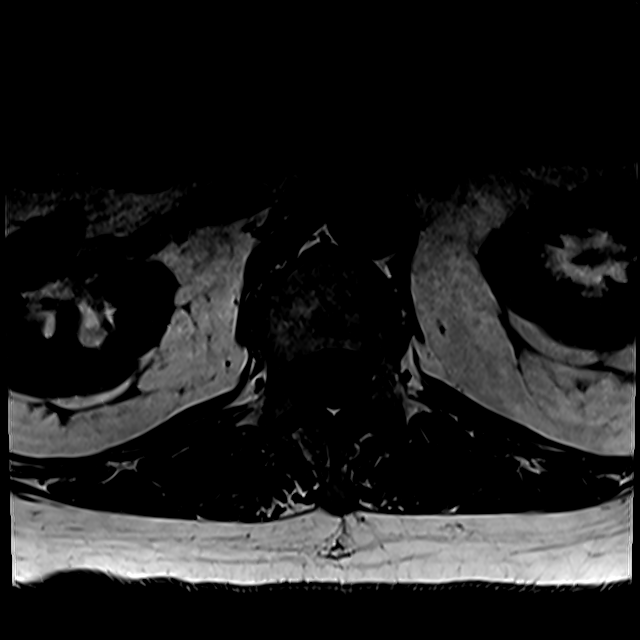

[Series 14: T2 · axial · 4.0mm · 0.28mm/px · z∈[-61,+117]mm · 6 of 39 slices shown (2 of 2)]
[im 1/39]
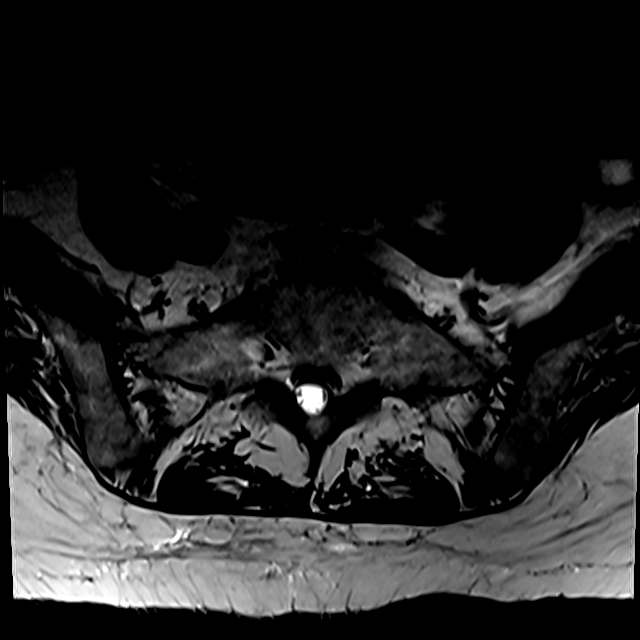
[im 6/39]
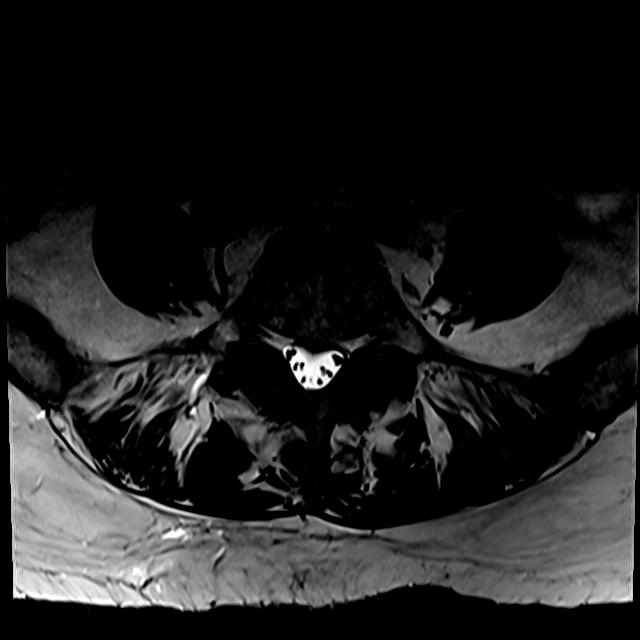
[im 11/39]
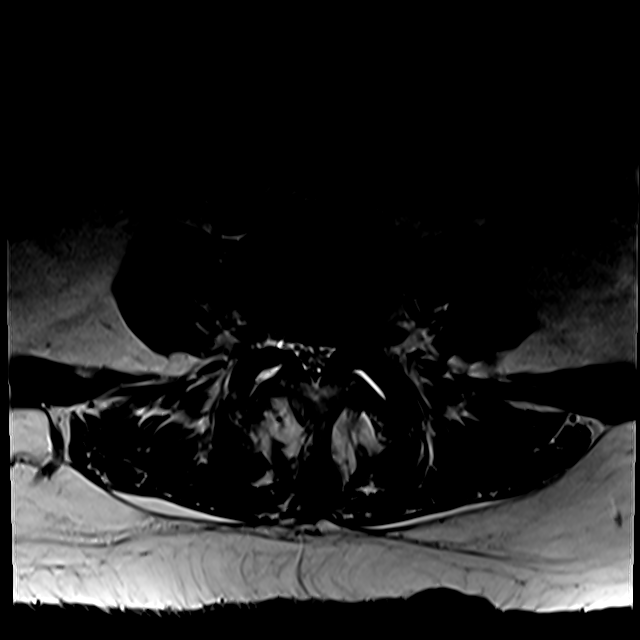
[im 17/39]
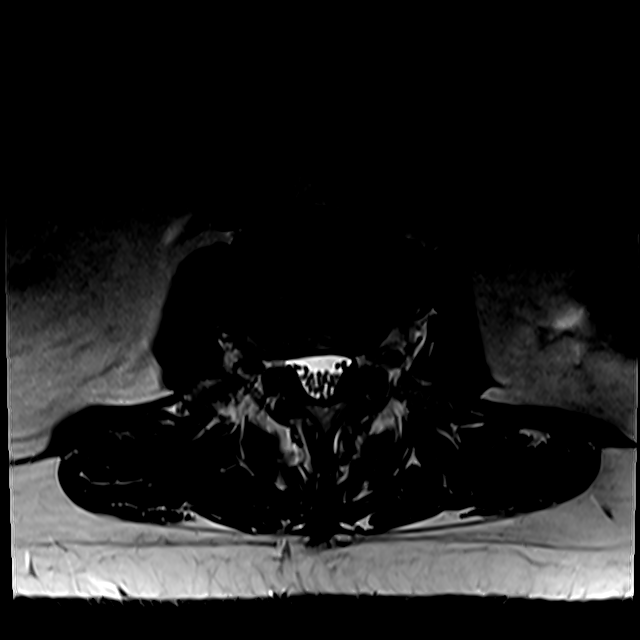
[im 20/39]
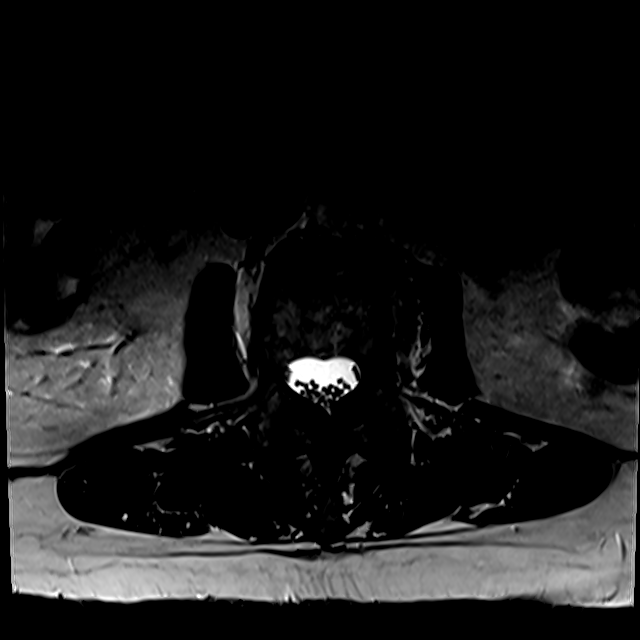
[im 33/39]
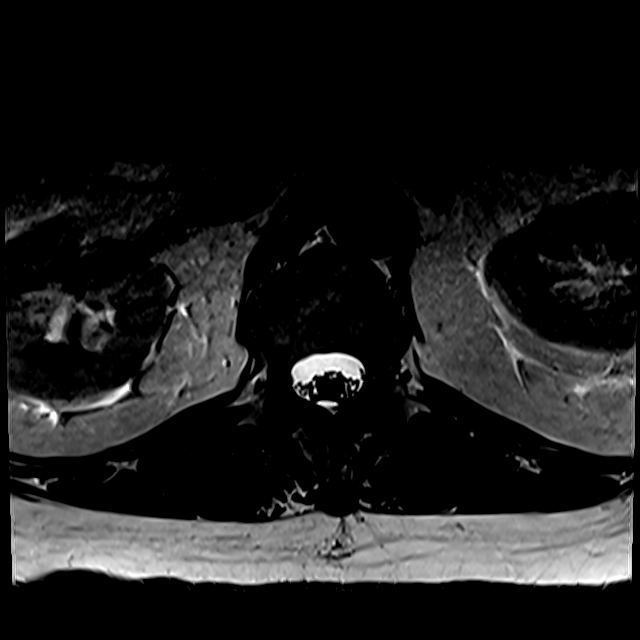

[18 of 48 positions shown; findings below may reference images not displayed]

FINDINGS: Segmentation:  Standard.

Alignment: 3 mm retrolisthesis L3-4. 4 mm retrolisthesis L5-S1. 3 mm
anterolisthesis L4-5. 1-2 mm retrolisthesis L2-3.

Vertebrae: Endplate reactive changes, but no worrisome osseous
lesion.

Conus medullaris and cauda equina: Conus extends to the L1 level.
Conus and cauda equina appear normal.

Paraspinal and other soft tissues: Unremarkable.

Disc levels:

L1-L2:  Unremarkable.

L2-L3: 2 mm retrolisthesis. Shallow protrusion. Facet arthropathy
worse on the LEFT. LEFT L3 neural impingement due to subarticular
zone narrowing.

L3-L4: 3 mm retrolisthesis. Central protrusion. Osseous spurring.
Posterior element hypertrophy. Mild to moderate stenosis. BILATERAL
L4 neural impingement.

L4-L5: 3 mm anterolisthesis. Central and leftward protrusion.
Posterior element hypertrophy. Moderate to severe stenosis. Previous
synovial cyst no longer identified. LEFT greater than RIGHT L4 and
L5 neural impingement. Small BILATERAL extra-spinal synovial cysts
are seen on either side of the L5 spinous process.

L5-S1: Advanced disc space narrowing. 4 mm retrolisthesis. Osseous
spurring. Posterior element hypertrophy. Mild stenosis. Borderline
subarticular zone and foraminal zone narrowing could affect the L5
and S1 nerve roots.

Compared with priors, the synovial cyst has resolved, but severe
stenosis at L4-5 remains.
IMPRESSION: Multilevel spondylosis as described. Severe spinal stenosis at L4-5
remains, but the synovial cyst is no longer present. Potentially
symptomatic neural impingement is also observed at L3-4 and L5-S1.

If further investigation desired, consider CT myelogram to assess
for relative contributions of each level to neural impingement, as
well as evaluate for dynamic instability.

## 2019-06-07 DIAGNOSIS — D510 Vitamin B12 deficiency anemia due to intrinsic factor deficiency: Secondary | ICD-10-CM | POA: Diagnosis not present

## 2019-06-07 DIAGNOSIS — E785 Hyperlipidemia, unspecified: Secondary | ICD-10-CM | POA: Diagnosis not present

## 2019-06-07 DIAGNOSIS — R002 Palpitations: Secondary | ICD-10-CM | POA: Diagnosis not present

## 2019-06-07 DIAGNOSIS — I10 Essential (primary) hypertension: Secondary | ICD-10-CM | POA: Diagnosis not present

## 2019-06-16 DIAGNOSIS — I10 Essential (primary) hypertension: Secondary | ICD-10-CM | POA: Diagnosis not present

## 2019-06-27 DIAGNOSIS — I10 Essential (primary) hypertension: Secondary | ICD-10-CM | POA: Diagnosis not present

## 2019-06-30 DIAGNOSIS — E119 Type 2 diabetes mellitus without complications: Secondary | ICD-10-CM | POA: Diagnosis not present

## 2019-07-07 DIAGNOSIS — R42 Dizziness and giddiness: Secondary | ICD-10-CM | POA: Diagnosis not present

## 2019-07-07 DIAGNOSIS — H6123 Impacted cerumen, bilateral: Secondary | ICD-10-CM | POA: Diagnosis not present

## 2019-07-13 DIAGNOSIS — R69 Illness, unspecified: Secondary | ICD-10-CM | POA: Diagnosis not present

## 2019-07-13 NOTE — Progress Notes (Signed)
Cardiology Office Note:    Date:  07/14/2019   ID:  Devany Paulman, DOB June 27, 1950, MRN [id number]  PCP:  Nicoletta Dress, MD  Cardiologist:  Shirlee More, MD   Referring MD: Nicoletta Dress, MD  ASSESSMENT:    1. Palpitation   2. Essential hypertension   3. Type 2 diabetes mellitus without complication, without long-term current use of insulin ([place])   4. Mixed hyperlipidemia    PLAN:    In order of problems listed above:  1. Strongly suggestive of atrial arrhythmia happening several times a day will utilize a ZIO monitor to capture events make a decision regarding treatment 2. Stable BP at target continue current treatment ARB thiazide diuretic 3. Stable diabetes managed by her PCP 4. Stable dyslipidemia continue her low intensity statin.  Next appointment: 6 weeks   Medication Adjustments/Labs and Tests Ordered: Current medicines are reviewed at length with the patient today.  Concerns regarding medicines are outlined above.  Orders Placed This Encounter  Procedures  . LONG TERM MONITOR (3-14 DAYS)  . EKG 12-Lead   No orders of the defined types were placed in this encounter.    Chief Complaint  Patient presents with  . Palpitations    History of Present Illness:    Shelby Mendoza is a 69 y.o. female who is being seen today for the evaluation of palpitation at the request of Nicoletta Dress, MD.  She is seen in the office for evaluation of palpitation.  When it began a year ago was associated with the blood pressure being elevated and a perception of that her heart was beating in her head.  Beta-blockers and alpha blockers both made the problem worse.  Since then the symptoms have altered and now she is intermittently aware of her heart quivering racing and her heart rate is greater than 92-100 bpm.  The episodes have not been captured and she has had no syncope chest pain edema or shortness of breath.  She takes no over-the-counter proarrhythmic  drugs.  Is at risk for atrial arrhythmia especially atrial fibrillation with age and hypertension will utilize a 1 week Zio monitor and she tells me she has episodes several times a day and I feel confident that we will be able to capture episodes and define whether or not she has a rhythm disturbance and guide treatment.  She requires an ischemia evaluation at this time. Past Medical History:  Diagnosis Date  . Depression   . Diabetes mellitus   . Hypertension   . Vitamin B12 deficiency     Past Surgical History:  Procedure Laterality Date  . TUBAL LIGATION      Current Medications: Current Meds  Medication Sig  . ALPRAZolam (XANAX) 0.5 MG tablet Take 0.5 mg by mouth at bedtime.    . Ascorbic Acid (VITAMIN C) 1000 MG tablet Take 1,000 mg by mouth daily.  Marland Kitchen aspirin EC 81 MG tablet Take 81 mg by mouth daily.  . Cholecalciferol (D3 ADULT PO) Take 1 capsule by mouth 3 (three) times a week.    . cloNIDine (CATAPRES) 0.2 MG tablet Take 0.2 mg by mouth 3 (three) times daily.   Marland Kitchen escitalopram (LEXAPRO) 20 MG tablet Take 20 mg by mouth daily.   Marland Kitchen lamoTRIgine (LAMICTAL) 25 MG tablet Take 25 mg by mouth in the morning, at noon, in the evening, and at bedtime.  Marland Kitchen omeprazole (PRILOSEC) 20 MG capsule Take 20 mg by mouth daily.    . QUEtiapine (  SEROQUEL) 100 MG tablet Take 100 mg by mouth at bedtime.  Marland Kitchen telmisartan-hydrochlorothiazide (MICARDIS HCT) 80-25 MG tablet Take 1 tablet by mouth daily.  Marland Kitchen thiamine (B-1) 100 MG/ML injection Inject 100 mg into the muscle every 30 (thirty) days.    Marland Kitchen zolpidem (AMBIEN) 5 MG tablet Take 5 mg by mouth at bedtime.       Allergies:   Patient has no known allergies.   Social History   Socioeconomic History  . Marital status: Unknown    Spouse name: Not on file  . Number of children: Not on file  . Years of education: Not on file  . Highest education level: Not on file  Occupational History  . Not on file  Tobacco Use  . Smoking status: Never Smoker  .  Smokeless tobacco: Never Used  Substance and Sexual Activity  . Alcohol use: No  . Drug use: Not on file  . Sexual activity: Not on file  Other Topics Concern  . Not on file  Social History Narrative  . Not on file   Social Determinants of Health   Financial Resource Strain:   . Difficulty of Paying Living Expenses:   Food Insecurity:   . Worried About Charity fundraiser in the Last Year:   . Arboriculturist in the Last Year:   Transportation Needs:   . Film/video editor (Medical):   Marland Kitchen Lack of Transportation (Non-Medical):   Physical Activity:   . Days of Exercise per Week:   . Minutes of Exercise per Session:   Stress:   . Feeling of Stress :   Social Connections:   . Frequency of Communication with Friends and Family:   . Frequency of Social Gatherings with Friends and Family:   . Attends Religious Services:   . Active Member of Clubs or Organizations:   . Attends Archivist Meetings:   Marland Kitchen Marital Status:      Family History: The patient's family history includes Colon cancer in her maternal aunt.  ROS:   Review of Systems  Constitution: Negative.  HENT: Negative.   Eyes: Negative.   Cardiovascular: Positive for palpitations.  Respiratory: Negative.   Endocrine: Negative.   Hematologic/Lymphatic: Negative.   Skin: Negative.   Musculoskeletal: Negative.   Gastrointestinal: Negative.   Genitourinary: Negative.   Neurological: Negative.   Psychiatric/Behavioral: The patient is nervous/anxious.   Allergic/Immunologic: Negative.    Please see the history of present illness.     All other systems reviewed and are negative.  EKGs/Labs/Other Studies Reviewed:    The following studies were reviewed today:   EKG:  EKG is  ordered today.  The ekg ordered today is personally reviewed and demonstrates sinus rhythm  Recent Labs: 06/07/2019 cholesterol 205 HDL 69 LDL 113 A1c 5.4% TSH normal Renal function normal creatinine 0.71  Physical Exam:      VS:  BP 128/80   Pulse 86   Ht 5\' 2"  (1.575 m)   Wt 195 lb (88.5 kg)   SpO2 99%   BMI 35.67 kg/m     Wt Readings from Last 3 Encounters:  07/14/19 195 lb (88.5 kg)  08/15/10 (!) 225 lb (102.1 kg)  08/01/10 (!) 232 lb (105.2 kg)     GEN:  Well nourished, well developed in no acute distress.  She has a tremor  HEENT: Normal NECK: No JVD; No carotid bruits LYMPHATICS: No lymphadenopathy CARDIAC: RRR, no murmurs, rubs, gallops RESPIRATORY:  Clear to auscultation without  rales, wheezing or rhonchi  ABDOMEN: Soft, non-tender, non-distended MUSCULOSKELETAL:  No edema; No deformity  SKIN: Warm and dry NEUROLOGIC:  Alert and oriented x 3 PSYCHIATRIC:  Normal affect     Signed, Shirlee More, MD  07/14/2019 4:00 PM    [facility]

## 2019-07-14 ENCOUNTER — Other Ambulatory Visit: Payer: Self-pay

## 2019-07-14 ENCOUNTER — Encounter: Payer: Self-pay | Admitting: Cardiology

## 2019-07-14 ENCOUNTER — Ambulatory Visit: Payer: Medicare HMO | Admitting: Cardiology

## 2019-07-14 ENCOUNTER — Ambulatory Visit (INDEPENDENT_AMBULATORY_CARE_PROVIDER_SITE_OTHER): Payer: Medicare HMO

## 2019-07-14 VITALS — BP 128/80 | HR 86 | Ht 62.0 in | Wt 195.0 lb

## 2019-07-14 DIAGNOSIS — E119 Type 2 diabetes mellitus without complications: Secondary | ICD-10-CM

## 2019-07-14 DIAGNOSIS — I10 Essential (primary) hypertension: Secondary | ICD-10-CM | POA: Diagnosis not present

## 2019-07-14 DIAGNOSIS — E782 Mixed hyperlipidemia: Secondary | ICD-10-CM

## 2019-07-14 DIAGNOSIS — R002 Palpitations: Secondary | ICD-10-CM

## 2019-07-14 NOTE — Patient Instructions (Signed)
Medication Instructions:  Your physician recommends that you continue on your current medications as directed. Please refer to the Current Medication list given to you today.  *If you need a refill on your cardiac medications before your next appointment, please call your pharmacy*   Lab Work: None If you have labs (blood work) drawn today and your tests are completely normal, you will receive your results only by: . MyChart Message (if you have MyChart) OR . A paper copy in the mail If you have any lab test that is abnormal or we need to change your treatment, we will call you to review the results.   Testing/Procedures: A zio monitor was ordered today. It will remain on for 7 days. You will then return monitor and event diary in provided box. It takes 1-2 weeks for report to be downloaded and returned to us. We will call you with the results. If monitor falls off or has orange flashing light, please call Zio for further instructions.      Follow-Up: At [facility], you and your health needs are our priority.  As part of our continuing mission to provide you with exceptional heart care, we have created designated Provider Care Teams.  These Care Teams include your primary Cardiologist (physician) and Advanced Practice Providers (APPs -  Physician Assistants and Nurse Practitioners) who all work together to provide you with the care you need, when you need it.  We recommend signing up for the patient portal called "MyChart".  Sign up information is provided on this After Visit Summary.  MyChart is used to connect with patients for Virtual Visits (Telemedicine).  Patients are able to view lab/test results, encounter notes, upcoming appointments, etc.  Non-urgent messages can be sent to your provider as well.   To learn more about what you can do with MyChart, go to [email or web address].    Your next appointment:   6 week(s)  The format for your next appointment:   In  Person  Provider:   Brian Munley, MD   Other Instructions  

## 2019-07-18 DIAGNOSIS — R69 Illness, unspecified: Secondary | ICD-10-CM | POA: Diagnosis not present

## 2019-07-28 DIAGNOSIS — R69 Illness, unspecified: Secondary | ICD-10-CM | POA: Diagnosis not present

## 2019-08-02 DIAGNOSIS — R002 Palpitations: Secondary | ICD-10-CM | POA: Diagnosis not present

## 2019-08-04 ENCOUNTER — Telehealth: Payer: Self-pay

## 2019-08-04 NOTE — Telephone Encounter (Signed)
Left message on patients voicemail to please return our call.   

## 2019-08-04 NOTE — Telephone Encounter (Signed)
-----   Message from Richardo Priest, MD sent at 08/04/2019 12:01 PM EDT ----- Normal or stable result  Good results I do not see a problem with heart rhythm

## 2019-08-05 ENCOUNTER — Telehealth: Payer: Self-pay

## 2019-08-05 NOTE — Telephone Encounter (Signed)
I have tried to reach the patient several times now in regards to her zio monitor results. I am sending a letter at this time.

## 2019-08-05 NOTE — Telephone Encounter (Signed)
Tried calling patient. No answer and no voicemail  for me to leave a message.

## 2019-08-10 DIAGNOSIS — R69 Illness, unspecified: Secondary | ICD-10-CM | POA: Diagnosis not present

## 2019-08-18 DIAGNOSIS — R69 Illness, unspecified: Secondary | ICD-10-CM | POA: Diagnosis not present

## 2019-08-18 DIAGNOSIS — Z008 Encounter for other general examination: Secondary | ICD-10-CM | POA: Diagnosis not present

## 2019-08-18 DIAGNOSIS — R0902 Hypoxemia: Secondary | ICD-10-CM | POA: Diagnosis not present

## 2019-08-18 DIAGNOSIS — R404 Transient alteration of awareness: Secondary | ICD-10-CM | POA: Diagnosis not present

## 2019-08-18 DIAGNOSIS — R000 Tachycardia, unspecified: Secondary | ICD-10-CM | POA: Diagnosis not present

## 2019-08-18 DIAGNOSIS — Z20822 Contact with and (suspected) exposure to covid-19: Secondary | ICD-10-CM | POA: Diagnosis not present

## 2019-08-19 DIAGNOSIS — M25422 Effusion, left elbow: Secondary | ICD-10-CM | POA: Diagnosis not present

## 2019-08-19 DIAGNOSIS — N390 Urinary tract infection, site not specified: Secondary | ICD-10-CM | POA: Diagnosis not present

## 2019-08-19 DIAGNOSIS — Z008 Encounter for other general examination: Secondary | ICD-10-CM | POA: Diagnosis not present

## 2019-08-19 DIAGNOSIS — M1990 Unspecified osteoarthritis, unspecified site: Secondary | ICD-10-CM | POA: Diagnosis not present

## 2019-08-19 DIAGNOSIS — Z79899 Other long term (current) drug therapy: Secondary | ICD-10-CM | POA: Diagnosis not present

## 2019-08-19 DIAGNOSIS — I10 Essential (primary) hypertension: Secondary | ICD-10-CM | POA: Diagnosis not present

## 2019-08-19 DIAGNOSIS — Z20822 Contact with and (suspected) exposure to covid-19: Secondary | ICD-10-CM | POA: Diagnosis not present

## 2019-08-19 DIAGNOSIS — R918 Other nonspecific abnormal finding of lung field: Secondary | ICD-10-CM | POA: Diagnosis not present

## 2019-08-19 DIAGNOSIS — R600 Localized edema: Secondary | ICD-10-CM | POA: Diagnosis not present

## 2019-08-19 DIAGNOSIS — Z043 Encounter for examination and observation following other accident: Secondary | ICD-10-CM | POA: Diagnosis not present

## 2019-08-19 DIAGNOSIS — E871 Hypo-osmolality and hyponatremia: Secondary | ICD-10-CM | POA: Diagnosis not present

## 2019-08-19 DIAGNOSIS — E559 Vitamin D deficiency, unspecified: Secondary | ICD-10-CM | POA: Diagnosis not present

## 2019-08-19 DIAGNOSIS — W19XXXA Unspecified fall, initial encounter: Secondary | ICD-10-CM | POA: Diagnosis not present

## 2019-08-19 DIAGNOSIS — F3113 Bipolar disorder, current episode manic without psychotic features, severe: Secondary | ICD-10-CM | POA: Diagnosis not present

## 2019-08-19 DIAGNOSIS — Z7982 Long term (current) use of aspirin: Secondary | ICD-10-CM | POA: Diagnosis not present

## 2019-08-19 DIAGNOSIS — J302 Other seasonal allergic rhinitis: Secondary | ICD-10-CM | POA: Diagnosis not present

## 2019-08-19 DIAGNOSIS — R9431 Abnormal electrocardiogram [ECG] [EKG]: Secondary | ICD-10-CM | POA: Diagnosis not present

## 2019-08-19 DIAGNOSIS — M1712 Unilateral primary osteoarthritis, left knee: Secondary | ICD-10-CM | POA: Diagnosis not present

## 2019-08-19 DIAGNOSIS — R69 Illness, unspecified: Secondary | ICD-10-CM | POA: Diagnosis not present

## 2019-08-19 DIAGNOSIS — M25562 Pain in left knee: Secondary | ICD-10-CM | POA: Diagnosis not present

## 2019-08-25 ENCOUNTER — Ambulatory Visit: Payer: Medicare HMO | Admitting: Cardiology

## 2019-09-01 DIAGNOSIS — R69 Illness, unspecified: Secondary | ICD-10-CM | POA: Diagnosis not present

## 2019-09-02 DIAGNOSIS — N390 Urinary tract infection, site not specified: Secondary | ICD-10-CM | POA: Diagnosis not present

## 2019-09-02 DIAGNOSIS — E871 Hypo-osmolality and hyponatremia: Secondary | ICD-10-CM | POA: Diagnosis not present

## 2019-09-02 DIAGNOSIS — D510 Vitamin B12 deficiency anemia due to intrinsic factor deficiency: Secondary | ICD-10-CM | POA: Diagnosis not present

## 2019-09-02 DIAGNOSIS — E785 Hyperlipidemia, unspecified: Secondary | ICD-10-CM | POA: Diagnosis not present

## 2019-09-02 DIAGNOSIS — I10 Essential (primary) hypertension: Secondary | ICD-10-CM | POA: Diagnosis not present

## 2019-09-02 DIAGNOSIS — R69 Illness, unspecified: Secondary | ICD-10-CM | POA: Diagnosis not present

## 2019-09-08 DIAGNOSIS — D509 Iron deficiency anemia, unspecified: Secondary | ICD-10-CM | POA: Diagnosis not present

## 2019-09-28 DIAGNOSIS — E119 Type 2 diabetes mellitus without complications: Secondary | ICD-10-CM | POA: Diagnosis not present

## 2019-09-28 DIAGNOSIS — D649 Anemia, unspecified: Secondary | ICD-10-CM | POA: Diagnosis not present

## 2019-10-06 DIAGNOSIS — D510 Vitamin B12 deficiency anemia due to intrinsic factor deficiency: Secondary | ICD-10-CM | POA: Diagnosis not present

## 2019-10-06 DIAGNOSIS — N390 Urinary tract infection, site not specified: Secondary | ICD-10-CM | POA: Diagnosis not present

## 2019-10-06 DIAGNOSIS — I10 Essential (primary) hypertension: Secondary | ICD-10-CM | POA: Diagnosis not present

## 2019-10-06 DIAGNOSIS — E785 Hyperlipidemia, unspecified: Secondary | ICD-10-CM | POA: Diagnosis not present

## 2019-10-06 DIAGNOSIS — E871 Hypo-osmolality and hyponatremia: Secondary | ICD-10-CM | POA: Diagnosis not present

## 2019-10-11 DIAGNOSIS — J342 Deviated nasal septum: Secondary | ICD-10-CM | POA: Diagnosis not present

## 2019-10-11 DIAGNOSIS — R69 Illness, unspecified: Secondary | ICD-10-CM | POA: Diagnosis not present

## 2019-10-11 DIAGNOSIS — D649 Anemia, unspecified: Secondary | ICD-10-CM | POA: Diagnosis not present

## 2019-10-11 DIAGNOSIS — H9319 Tinnitus, unspecified ear: Secondary | ICD-10-CM | POA: Diagnosis not present

## 2019-10-12 DIAGNOSIS — I10 Essential (primary) hypertension: Secondary | ICD-10-CM | POA: Diagnosis not present

## 2019-12-05 DIAGNOSIS — R69 Illness, unspecified: Secondary | ICD-10-CM | POA: Diagnosis not present

## 2019-12-28 DIAGNOSIS — E119 Type 2 diabetes mellitus without complications: Secondary | ICD-10-CM | POA: Diagnosis not present

## 2020-10-15 DIAGNOSIS — F312 Bipolar disorder, current episode manic severe with psychotic features: Secondary | ICD-10-CM | POA: Diagnosis not present

## 2020-10-19 DIAGNOSIS — H6123 Impacted cerumen, bilateral: Secondary | ICD-10-CM | POA: Diagnosis not present

## 2020-10-19 DIAGNOSIS — K5900 Constipation, unspecified: Secondary | ICD-10-CM | POA: Diagnosis not present

## 2020-11-13 DIAGNOSIS — F319 Bipolar disorder, unspecified: Secondary | ICD-10-CM | POA: Diagnosis not present

## 2020-11-13 DIAGNOSIS — Z791 Long term (current) use of non-steroidal anti-inflammatories (NSAID): Secondary | ICD-10-CM | POA: Diagnosis not present

## 2020-11-13 DIAGNOSIS — R918 Other nonspecific abnormal finding of lung field: Secondary | ICD-10-CM | POA: Diagnosis not present

## 2020-11-13 DIAGNOSIS — R0602 Shortness of breath: Secondary | ICD-10-CM | POA: Diagnosis not present

## 2020-11-13 DIAGNOSIS — Z888 Allergy status to other drugs, medicaments and biological substances status: Secondary | ICD-10-CM | POA: Diagnosis not present

## 2020-11-13 DIAGNOSIS — U071 COVID-19: Secondary | ICD-10-CM | POA: Diagnosis not present

## 2020-11-13 DIAGNOSIS — Z7982 Long term (current) use of aspirin: Secondary | ICD-10-CM | POA: Diagnosis not present

## 2020-11-13 DIAGNOSIS — E871 Hypo-osmolality and hyponatremia: Secondary | ICD-10-CM | POA: Diagnosis not present

## 2020-11-13 DIAGNOSIS — Z79899 Other long term (current) drug therapy: Secondary | ICD-10-CM | POA: Diagnosis not present

## 2020-11-13 DIAGNOSIS — J9811 Atelectasis: Secondary | ICD-10-CM | POA: Diagnosis not present

## 2020-11-13 DIAGNOSIS — I10 Essential (primary) hypertension: Secondary | ICD-10-CM | POA: Diagnosis not present

## 2020-11-14 DIAGNOSIS — R918 Other nonspecific abnormal finding of lung field: Secondary | ICD-10-CM | POA: Diagnosis not present

## 2020-11-14 DIAGNOSIS — R0602 Shortness of breath: Secondary | ICD-10-CM | POA: Diagnosis not present

## 2020-11-21 DIAGNOSIS — E871 Hypo-osmolality and hyponatremia: Secondary | ICD-10-CM | POA: Diagnosis not present

## 2020-11-21 DIAGNOSIS — J984 Other disorders of lung: Secondary | ICD-10-CM | POA: Diagnosis not present

## 2020-11-21 DIAGNOSIS — U071 COVID-19: Secondary | ICD-10-CM | POA: Diagnosis not present

## 2020-11-21 DIAGNOSIS — J209 Acute bronchitis, unspecified: Secondary | ICD-10-CM | POA: Diagnosis not present

## 2020-11-21 DIAGNOSIS — Z79899 Other long term (current) drug therapy: Secondary | ICD-10-CM | POA: Diagnosis not present

## 2020-11-21 DIAGNOSIS — I10 Essential (primary) hypertension: Secondary | ICD-10-CM | POA: Diagnosis not present

## 2020-11-21 DIAGNOSIS — J028 Acute pharyngitis due to other specified organisms: Secondary | ICD-10-CM | POA: Diagnosis not present

## 2020-11-21 DIAGNOSIS — Z888 Allergy status to other drugs, medicaments and biological substances status: Secondary | ICD-10-CM | POA: Diagnosis not present

## 2020-11-21 DIAGNOSIS — Z7982 Long term (current) use of aspirin: Secondary | ICD-10-CM | POA: Diagnosis not present

## 2020-11-21 DIAGNOSIS — J986 Disorders of diaphragm: Secondary | ICD-10-CM | POA: Diagnosis not present

## 2020-11-21 DIAGNOSIS — F319 Bipolar disorder, unspecified: Secondary | ICD-10-CM | POA: Diagnosis not present

## 2020-11-21 DIAGNOSIS — R0602 Shortness of breath: Secondary | ICD-10-CM | POA: Diagnosis not present

## 2020-11-21 DIAGNOSIS — B9689 Other specified bacterial agents as the cause of diseases classified elsewhere: Secondary | ICD-10-CM | POA: Diagnosis not present

## 2021-01-01 ENCOUNTER — Encounter: Payer: Self-pay | Admitting: Internal Medicine

## 2021-01-15 DIAGNOSIS — F312 Bipolar disorder, current episode manic severe with psychotic features: Secondary | ICD-10-CM | POA: Diagnosis not present

## 2021-02-12 DIAGNOSIS — E871 Hypo-osmolality and hyponatremia: Secondary | ICD-10-CM | POA: Diagnosis not present

## 2021-02-12 DIAGNOSIS — D510 Vitamin B12 deficiency anemia due to intrinsic factor deficiency: Secondary | ICD-10-CM | POA: Diagnosis not present

## 2021-02-12 DIAGNOSIS — R399 Unspecified symptoms and signs involving the genitourinary system: Secondary | ICD-10-CM | POA: Diagnosis not present

## 2021-02-12 DIAGNOSIS — H6123 Impacted cerumen, bilateral: Secondary | ICD-10-CM | POA: Diagnosis not present

## 2021-02-12 DIAGNOSIS — E785 Hyperlipidemia, unspecified: Secondary | ICD-10-CM | POA: Diagnosis not present

## 2021-02-12 DIAGNOSIS — Z139 Encounter for screening, unspecified: Secondary | ICD-10-CM | POA: Diagnosis not present

## 2021-02-12 DIAGNOSIS — R7301 Impaired fasting glucose: Secondary | ICD-10-CM | POA: Diagnosis not present

## 2021-02-12 DIAGNOSIS — I10 Essential (primary) hypertension: Secondary | ICD-10-CM | POA: Diagnosis not present

## 2021-04-08 DIAGNOSIS — I10 Essential (primary) hypertension: Secondary | ICD-10-CM | POA: Diagnosis not present

## 2021-04-09 DIAGNOSIS — F312 Bipolar disorder, current episode manic severe with psychotic features: Secondary | ICD-10-CM | POA: Diagnosis not present

## 2021-05-09 DIAGNOSIS — Z8616 Personal history of COVID-19: Secondary | ICD-10-CM | POA: Diagnosis not present

## 2021-05-09 DIAGNOSIS — S79912A Unspecified injury of left hip, initial encounter: Secondary | ICD-10-CM | POA: Diagnosis not present

## 2021-05-09 DIAGNOSIS — M47816 Spondylosis without myelopathy or radiculopathy, lumbar region: Secondary | ICD-10-CM | POA: Diagnosis not present

## 2021-05-09 DIAGNOSIS — M4316 Spondylolisthesis, lumbar region: Secondary | ICD-10-CM | POA: Diagnosis not present

## 2021-05-09 DIAGNOSIS — M79605 Pain in left leg: Secondary | ICD-10-CM | POA: Diagnosis not present

## 2021-05-09 DIAGNOSIS — M25552 Pain in left hip: Secondary | ICD-10-CM | POA: Diagnosis not present

## 2021-05-09 DIAGNOSIS — Z79899 Other long term (current) drug therapy: Secondary | ICD-10-CM | POA: Diagnosis not present

## 2021-05-09 DIAGNOSIS — I10 Essential (primary) hypertension: Secondary | ICD-10-CM | POA: Diagnosis not present

## 2021-05-09 DIAGNOSIS — M5136 Other intervertebral disc degeneration, lumbar region: Secondary | ICD-10-CM | POA: Diagnosis not present

## 2021-05-14 DIAGNOSIS — E119 Type 2 diabetes mellitus without complications: Secondary | ICD-10-CM | POA: Diagnosis not present

## 2021-05-14 DIAGNOSIS — M25552 Pain in left hip: Secondary | ICD-10-CM | POA: Diagnosis not present

## 2021-05-28 DIAGNOSIS — F312 Bipolar disorder, current episode manic severe with psychotic features: Secondary | ICD-10-CM | POA: Diagnosis not present

## 2021-06-07 DIAGNOSIS — M25559 Pain in unspecified hip: Secondary | ICD-10-CM | POA: Diagnosis not present

## 2021-06-18 DIAGNOSIS — F312 Bipolar disorder, current episode manic severe with psychotic features: Secondary | ICD-10-CM | POA: Diagnosis not present

## 2021-06-19 DIAGNOSIS — S32810A Multiple fractures of pelvis with stable disruption of pelvic ring, initial encounter for closed fracture: Secondary | ICD-10-CM | POA: Diagnosis not present

## 2021-06-19 DIAGNOSIS — M25559 Pain in unspecified hip: Secondary | ICD-10-CM | POA: Diagnosis not present

## 2021-06-27 DIAGNOSIS — M25559 Pain in unspecified hip: Secondary | ICD-10-CM | POA: Diagnosis not present

## 2021-06-27 DIAGNOSIS — S3210XD Unspecified fracture of sacrum, subsequent encounter for fracture with routine healing: Secondary | ICD-10-CM | POA: Diagnosis not present

## 2021-06-27 DIAGNOSIS — S32592A Other specified fracture of left pubis, initial encounter for closed fracture: Secondary | ICD-10-CM | POA: Diagnosis not present

## 2021-08-27 DIAGNOSIS — F312 Bipolar disorder, current episode manic severe with psychotic features: Secondary | ICD-10-CM | POA: Diagnosis not present

## 2021-09-09 DIAGNOSIS — D510 Vitamin B12 deficiency anemia due to intrinsic factor deficiency: Secondary | ICD-10-CM | POA: Diagnosis not present

## 2021-09-09 DIAGNOSIS — R7301 Impaired fasting glucose: Secondary | ICD-10-CM | POA: Diagnosis not present

## 2021-09-09 DIAGNOSIS — E785 Hyperlipidemia, unspecified: Secondary | ICD-10-CM | POA: Diagnosis not present

## 2021-09-09 DIAGNOSIS — Z6837 Body mass index (BMI) 37.0-37.9, adult: Secondary | ICD-10-CM | POA: Diagnosis not present

## 2021-09-09 DIAGNOSIS — H6123 Impacted cerumen, bilateral: Secondary | ICD-10-CM | POA: Diagnosis not present

## 2021-09-09 DIAGNOSIS — I10 Essential (primary) hypertension: Secondary | ICD-10-CM | POA: Diagnosis not present

## 2021-09-09 DIAGNOSIS — E871 Hypo-osmolality and hyponatremia: Secondary | ICD-10-CM | POA: Diagnosis not present

## 2021-09-19 DIAGNOSIS — K5900 Constipation, unspecified: Secondary | ICD-10-CM | POA: Diagnosis not present

## 2021-10-01 DIAGNOSIS — L821 Other seborrheic keratosis: Secondary | ICD-10-CM | POA: Diagnosis not present

## 2021-10-01 DIAGNOSIS — L578 Other skin changes due to chronic exposure to nonionizing radiation: Secondary | ICD-10-CM | POA: Diagnosis not present

## 2021-10-01 DIAGNOSIS — L638 Other alopecia areata: Secondary | ICD-10-CM | POA: Diagnosis not present

## 2021-11-19 DIAGNOSIS — F312 Bipolar disorder, current episode manic severe with psychotic features: Secondary | ICD-10-CM | POA: Diagnosis not present

## 2021-11-27 DIAGNOSIS — K5900 Constipation, unspecified: Secondary | ICD-10-CM | POA: Diagnosis not present

## 2021-12-12 DIAGNOSIS — F339 Major depressive disorder, recurrent, unspecified: Secondary | ICD-10-CM | POA: Diagnosis not present

## 2021-12-12 DIAGNOSIS — K5900 Constipation, unspecified: Secondary | ICD-10-CM | POA: Diagnosis not present

## 2021-12-12 DIAGNOSIS — K5730 Diverticulosis of large intestine without perforation or abscess without bleeding: Secondary | ICD-10-CM | POA: Diagnosis not present

## 2022-02-25 DIAGNOSIS — F312 Bipolar disorder, current episode manic severe with psychotic features: Secondary | ICD-10-CM | POA: Diagnosis not present

## 2022-05-20 DIAGNOSIS — F312 Bipolar disorder, current episode manic severe with psychotic features: Secondary | ICD-10-CM | POA: Diagnosis not present

## 2022-07-29 DIAGNOSIS — E871 Hypo-osmolality and hyponatremia: Secondary | ICD-10-CM | POA: Diagnosis not present

## 2022-07-29 DIAGNOSIS — I10 Essential (primary) hypertension: Secondary | ICD-10-CM | POA: Diagnosis not present

## 2022-07-29 DIAGNOSIS — H6122 Impacted cerumen, left ear: Secondary | ICD-10-CM | POA: Diagnosis not present

## 2022-07-29 DIAGNOSIS — R7301 Impaired fasting glucose: Secondary | ICD-10-CM | POA: Diagnosis not present

## 2022-07-29 DIAGNOSIS — D510 Vitamin B12 deficiency anemia due to intrinsic factor deficiency: Secondary | ICD-10-CM | POA: Diagnosis not present

## 2022-07-29 DIAGNOSIS — E785 Hyperlipidemia, unspecified: Secondary | ICD-10-CM | POA: Diagnosis not present

## 2022-08-26 DIAGNOSIS — E669 Obesity, unspecified: Secondary | ICD-10-CM | POA: Diagnosis not present

## 2022-08-26 DIAGNOSIS — I10 Essential (primary) hypertension: Secondary | ICD-10-CM | POA: Diagnosis not present

## 2022-09-10 DIAGNOSIS — I10 Essential (primary) hypertension: Secondary | ICD-10-CM | POA: Diagnosis not present

## 2022-10-29 DIAGNOSIS — E871 Hypo-osmolality and hyponatremia: Secondary | ICD-10-CM | POA: Diagnosis not present

## 2022-10-29 DIAGNOSIS — F319 Bipolar disorder, unspecified: Secondary | ICD-10-CM | POA: Diagnosis not present

## 2022-10-29 DIAGNOSIS — D510 Vitamin B12 deficiency anemia due to intrinsic factor deficiency: Secondary | ICD-10-CM | POA: Diagnosis not present

## 2022-10-29 DIAGNOSIS — R7301 Impaired fasting glucose: Secondary | ICD-10-CM | POA: Diagnosis not present

## 2022-10-29 DIAGNOSIS — E785 Hyperlipidemia, unspecified: Secondary | ICD-10-CM | POA: Diagnosis not present

## 2022-10-29 DIAGNOSIS — F5104 Psychophysiologic insomnia: Secondary | ICD-10-CM | POA: Diagnosis not present

## 2022-10-29 DIAGNOSIS — I10 Essential (primary) hypertension: Secondary | ICD-10-CM | POA: Diagnosis not present

## 2022-11-03 DIAGNOSIS — K5904 Chronic idiopathic constipation: Secondary | ICD-10-CM | POA: Diagnosis not present

## 2022-11-03 DIAGNOSIS — K5900 Constipation, unspecified: Secondary | ICD-10-CM | POA: Diagnosis not present

## 2022-11-03 DIAGNOSIS — R7989 Other specified abnormal findings of blood chemistry: Secondary | ICD-10-CM | POA: Diagnosis not present

## 2022-11-03 DIAGNOSIS — M6289 Other specified disorders of muscle: Secondary | ICD-10-CM | POA: Diagnosis not present

## 2022-11-19 DIAGNOSIS — R195 Other fecal abnormalities: Secondary | ICD-10-CM | POA: Diagnosis not present

## 2022-11-19 DIAGNOSIS — K5904 Chronic idiopathic constipation: Secondary | ICD-10-CM | POA: Diagnosis not present

## 2022-11-19 DIAGNOSIS — K449 Diaphragmatic hernia without obstruction or gangrene: Secondary | ICD-10-CM | POA: Diagnosis not present

## 2022-11-19 DIAGNOSIS — K5900 Constipation, unspecified: Secondary | ICD-10-CM | POA: Diagnosis not present

## 2022-11-19 DIAGNOSIS — I7090 Unspecified atherosclerosis: Secondary | ICD-10-CM | POA: Diagnosis not present

## 2022-11-19 DIAGNOSIS — R7989 Other specified abnormal findings of blood chemistry: Secondary | ICD-10-CM | POA: Diagnosis not present

## 2022-11-19 DIAGNOSIS — K5730 Diverticulosis of large intestine without perforation or abscess without bleeding: Secondary | ICD-10-CM | POA: Diagnosis not present

## 2022-11-19 DIAGNOSIS — M6289 Other specified disorders of muscle: Secondary | ICD-10-CM | POA: Diagnosis not present

## 2023-01-01 DIAGNOSIS — H25813 Combined forms of age-related cataract, bilateral: Secondary | ICD-10-CM | POA: Diagnosis not present

## 2023-01-29 DIAGNOSIS — R7989 Other specified abnormal findings of blood chemistry: Secondary | ICD-10-CM | POA: Diagnosis not present

## 2023-02-06 DIAGNOSIS — Z01818 Encounter for other preprocedural examination: Secondary | ICD-10-CM | POA: Diagnosis not present

## 2023-02-06 DIAGNOSIS — H25811 Combined forms of age-related cataract, right eye: Secondary | ICD-10-CM | POA: Diagnosis not present

## 2023-02-06 DIAGNOSIS — H25813 Combined forms of age-related cataract, bilateral: Secondary | ICD-10-CM | POA: Diagnosis not present

## 2023-02-17 DIAGNOSIS — H25811 Combined forms of age-related cataract, right eye: Secondary | ICD-10-CM | POA: Diagnosis not present

## 2023-02-17 DIAGNOSIS — M1990 Unspecified osteoarthritis, unspecified site: Secondary | ICD-10-CM | POA: Diagnosis not present

## 2023-02-17 DIAGNOSIS — H52223 Regular astigmatism, bilateral: Secondary | ICD-10-CM | POA: Diagnosis not present

## 2023-02-17 DIAGNOSIS — H53042 Amblyopia suspect, left eye: Secondary | ICD-10-CM | POA: Diagnosis not present

## 2023-02-17 DIAGNOSIS — F419 Anxiety disorder, unspecified: Secondary | ICD-10-CM | POA: Diagnosis not present

## 2023-02-17 DIAGNOSIS — Z79899 Other long term (current) drug therapy: Secondary | ICD-10-CM | POA: Diagnosis not present

## 2023-02-17 DIAGNOSIS — H259 Unspecified age-related cataract: Secondary | ICD-10-CM | POA: Diagnosis not present

## 2023-02-17 DIAGNOSIS — E785 Hyperlipidemia, unspecified: Secondary | ICD-10-CM | POA: Diagnosis not present

## 2023-02-17 DIAGNOSIS — I10 Essential (primary) hypertension: Secondary | ICD-10-CM | POA: Diagnosis not present

## 2023-02-17 DIAGNOSIS — H47393 Other disorders of optic disc, bilateral: Secondary | ICD-10-CM | POA: Diagnosis not present

## 2023-02-17 DIAGNOSIS — F32A Depression, unspecified: Secondary | ICD-10-CM | POA: Diagnosis not present

## 2023-02-17 DIAGNOSIS — K219 Gastro-esophageal reflux disease without esophagitis: Secondary | ICD-10-CM | POA: Diagnosis not present

## 2023-03-03 DIAGNOSIS — I10 Essential (primary) hypertension: Secondary | ICD-10-CM | POA: Diagnosis not present

## 2023-03-03 DIAGNOSIS — Z79899 Other long term (current) drug therapy: Secondary | ICD-10-CM | POA: Diagnosis not present

## 2023-03-03 DIAGNOSIS — H259 Unspecified age-related cataract: Secondary | ICD-10-CM | POA: Diagnosis not present

## 2023-03-03 DIAGNOSIS — H25812 Combined forms of age-related cataract, left eye: Secondary | ICD-10-CM | POA: Diagnosis not present

## 2023-03-03 DIAGNOSIS — F419 Anxiety disorder, unspecified: Secondary | ICD-10-CM | POA: Diagnosis not present

## 2023-03-03 DIAGNOSIS — H52223 Regular astigmatism, bilateral: Secondary | ICD-10-CM | POA: Diagnosis not present

## 2023-03-03 DIAGNOSIS — K219 Gastro-esophageal reflux disease without esophagitis: Secondary | ICD-10-CM | POA: Diagnosis not present

## 2023-03-03 DIAGNOSIS — H53042 Amblyopia suspect, left eye: Secondary | ICD-10-CM | POA: Diagnosis not present

## 2023-03-03 DIAGNOSIS — E785 Hyperlipidemia, unspecified: Secondary | ICD-10-CM | POA: Diagnosis not present

## 2023-03-03 DIAGNOSIS — F329 Major depressive disorder, single episode, unspecified: Secondary | ICD-10-CM | POA: Diagnosis not present

## 2023-06-01 DIAGNOSIS — H47393 Other disorders of optic disc, bilateral: Secondary | ICD-10-CM | POA: Diagnosis not present

## 2023-10-27 DIAGNOSIS — R110 Nausea: Secondary | ICD-10-CM | POA: Diagnosis not present

## 2023-10-27 DIAGNOSIS — K5904 Chronic idiopathic constipation: Secondary | ICD-10-CM | POA: Diagnosis not present

## 2023-10-27 DIAGNOSIS — K5900 Constipation, unspecified: Secondary | ICD-10-CM | POA: Diagnosis not present

## 2023-10-27 DIAGNOSIS — R109 Unspecified abdominal pain: Secondary | ICD-10-CM | POA: Diagnosis not present

## 2023-10-27 DIAGNOSIS — M6289 Other specified disorders of muscle: Secondary | ICD-10-CM | POA: Diagnosis not present

## 2023-10-27 DIAGNOSIS — R7989 Other specified abnormal findings of blood chemistry: Secondary | ICD-10-CM | POA: Diagnosis not present

## 2023-10-28 DIAGNOSIS — K5900 Constipation, unspecified: Secondary | ICD-10-CM | POA: Diagnosis not present

## 2023-10-28 DIAGNOSIS — R109 Unspecified abdominal pain: Secondary | ICD-10-CM | POA: Diagnosis not present

## 2023-10-28 DIAGNOSIS — M6289 Other specified disorders of muscle: Secondary | ICD-10-CM | POA: Diagnosis not present

## 2023-10-28 DIAGNOSIS — K5904 Chronic idiopathic constipation: Secondary | ICD-10-CM | POA: Diagnosis not present

## 2023-11-30 DIAGNOSIS — H47393 Other disorders of optic disc, bilateral: Secondary | ICD-10-CM | POA: Diagnosis not present

## 2023-11-30 DIAGNOSIS — H524 Presbyopia: Secondary | ICD-10-CM | POA: Diagnosis not present

## 2024-02-26 DIAGNOSIS — R109 Unspecified abdominal pain: Secondary | ICD-10-CM | POA: Diagnosis not present

## 2024-02-26 DIAGNOSIS — K5904 Chronic idiopathic constipation: Secondary | ICD-10-CM | POA: Diagnosis not present

## 2024-02-26 DIAGNOSIS — R7989 Other specified abnormal findings of blood chemistry: Secondary | ICD-10-CM | POA: Diagnosis not present

## 2024-02-26 DIAGNOSIS — M6289 Other specified disorders of muscle: Secondary | ICD-10-CM | POA: Diagnosis not present

## 2024-02-26 DIAGNOSIS — K5900 Constipation, unspecified: Secondary | ICD-10-CM | POA: Diagnosis not present

## 2024-02-26 DIAGNOSIS — R110 Nausea: Secondary | ICD-10-CM | POA: Diagnosis not present
